# Patient Record
Sex: Female | Born: 1965 | Race: White | Hispanic: No | Marital: Married | Smoking: Former smoker
Health system: Southern US, Community
[De-identification: ages and names within clinical notes are randomized; demographics above are authoritative.]

## PROBLEM LIST (undated history)

## (undated) DIAGNOSIS — R011 Cardiac murmur, unspecified: Secondary | ICD-10-CM

## (undated) DIAGNOSIS — T7840XA Allergy, unspecified, initial encounter: Secondary | ICD-10-CM

## (undated) DIAGNOSIS — J45909 Unspecified asthma, uncomplicated: Secondary | ICD-10-CM

## (undated) DIAGNOSIS — F419 Anxiety disorder, unspecified: Secondary | ICD-10-CM

## (undated) HISTORY — PX: BREAST BIOPSY: SHX20

## (undated) HISTORY — DX: Cardiac murmur, unspecified: R01.1

## (undated) HISTORY — PX: INTRAUTERINE DEVICE (IUD) INSERTION: SHX5877

## (undated) HISTORY — PX: REDUCTION MAMMAPLASTY: SUR839

## (undated) HISTORY — DX: Anxiety disorder, unspecified: F41.9

## (undated) HISTORY — DX: Unspecified asthma, uncomplicated: J45.909

## (undated) HISTORY — DX: Allergy, unspecified, initial encounter: T78.40XA

## (undated) HISTORY — PX: APPENDECTOMY: SHX54

---

## 2003-08-08 LAB — CONVERTED CEMR LAB
HDL: 61 mg/dL
LDL Cholesterol: 165 mg/dL

## 2005-06-21 ENCOUNTER — Encounter: Payer: Self-pay | Admitting: Family Medicine

## 2005-10-30 ENCOUNTER — Ambulatory Visit: Payer: Self-pay | Admitting: Family Medicine

## 2005-12-20 ENCOUNTER — Telehealth (INDEPENDENT_AMBULATORY_CARE_PROVIDER_SITE_OTHER): Payer: Self-pay | Admitting: *Deleted

## 2005-12-20 ENCOUNTER — Telehealth: Payer: Self-pay | Admitting: Family Medicine

## 2005-12-23 ENCOUNTER — Encounter: Payer: Self-pay | Admitting: Family Medicine

## 2005-12-23 DIAGNOSIS — R51 Headache: Secondary | ICD-10-CM | POA: Insufficient documentation

## 2005-12-23 DIAGNOSIS — J3089 Other allergic rhinitis: Secondary | ICD-10-CM

## 2005-12-23 DIAGNOSIS — F411 Generalized anxiety disorder: Secondary | ICD-10-CM | POA: Insufficient documentation

## 2005-12-23 DIAGNOSIS — R519 Headache, unspecified: Secondary | ICD-10-CM | POA: Insufficient documentation

## 2005-12-23 DIAGNOSIS — M542 Cervicalgia: Secondary | ICD-10-CM | POA: Insufficient documentation

## 2005-12-26 ENCOUNTER — Ambulatory Visit: Payer: Self-pay | Admitting: Family Medicine

## 2005-12-26 DIAGNOSIS — E785 Hyperlipidemia, unspecified: Secondary | ICD-10-CM

## 2005-12-26 DIAGNOSIS — M546 Pain in thoracic spine: Secondary | ICD-10-CM

## 2005-12-26 DIAGNOSIS — F341 Dysthymic disorder: Secondary | ICD-10-CM

## 2006-01-06 ENCOUNTER — Telehealth: Payer: Self-pay | Admitting: Family Medicine

## 2006-01-07 ENCOUNTER — Encounter: Payer: Self-pay | Admitting: Family Medicine

## 2006-01-15 ENCOUNTER — Ambulatory Visit: Payer: Self-pay | Admitting: Family Medicine

## 2006-01-28 ENCOUNTER — Telehealth: Payer: Self-pay | Admitting: Family Medicine

## 2006-01-30 ENCOUNTER — Telehealth (INDEPENDENT_AMBULATORY_CARE_PROVIDER_SITE_OTHER): Payer: Self-pay | Admitting: *Deleted

## 2006-01-31 ENCOUNTER — Telehealth (INDEPENDENT_AMBULATORY_CARE_PROVIDER_SITE_OTHER): Payer: Self-pay | Admitting: *Deleted

## 2006-02-19 ENCOUNTER — Encounter: Payer: Self-pay | Admitting: Family Medicine

## 2006-03-31 ENCOUNTER — Encounter: Payer: Self-pay | Admitting: Family Medicine

## 2006-04-22 ENCOUNTER — Encounter: Payer: Self-pay | Admitting: Family Medicine

## 2006-04-28 ENCOUNTER — Telehealth: Payer: Self-pay | Admitting: Family Medicine

## 2006-04-30 ENCOUNTER — Telehealth: Payer: Self-pay | Admitting: Family Medicine

## 2006-08-06 ENCOUNTER — Encounter: Payer: Self-pay | Admitting: Family Medicine

## 2006-10-23 ENCOUNTER — Telehealth: Payer: Self-pay | Admitting: Family Medicine

## 2006-10-23 ENCOUNTER — Ambulatory Visit: Payer: Self-pay | Admitting: Family Medicine

## 2006-10-23 DIAGNOSIS — N920 Excessive and frequent menstruation with regular cycle: Secondary | ICD-10-CM

## 2006-10-23 DIAGNOSIS — IMO0001 Reserved for inherently not codable concepts without codable children: Secondary | ICD-10-CM

## 2006-10-23 DIAGNOSIS — J019 Acute sinusitis, unspecified: Secondary | ICD-10-CM

## 2006-10-23 LAB — CONVERTED CEMR LAB
Hemoglobin: 12.9 g/dL (ref 12.0–15.0)
MCV: 88.9 fL (ref 78.0–100.0)
RDW: 13.1 % (ref 11.5–14.0)
Sed Rate: 7 mm/hr (ref 0–22)
Total CK: 80 units/L (ref 7–177)

## 2006-10-24 ENCOUNTER — Telehealth (INDEPENDENT_AMBULATORY_CARE_PROVIDER_SITE_OTHER): Payer: Self-pay | Admitting: *Deleted

## 2006-11-10 ENCOUNTER — Ambulatory Visit: Payer: Self-pay | Admitting: Family Medicine

## 2006-11-10 DIAGNOSIS — D239 Other benign neoplasm of skin, unspecified: Secondary | ICD-10-CM | POA: Insufficient documentation

## 2006-11-12 ENCOUNTER — Telehealth: Payer: Self-pay | Admitting: Family Medicine

## 2006-11-12 ENCOUNTER — Telehealth (INDEPENDENT_AMBULATORY_CARE_PROVIDER_SITE_OTHER): Payer: Self-pay | Admitting: *Deleted

## 2006-11-17 ENCOUNTER — Telehealth (INDEPENDENT_AMBULATORY_CARE_PROVIDER_SITE_OTHER): Payer: Self-pay | Admitting: *Deleted

## 2006-11-27 ENCOUNTER — Ambulatory Visit: Payer: Self-pay | Admitting: Family Medicine

## 2006-12-04 ENCOUNTER — Telehealth: Payer: Self-pay | Admitting: Family Medicine

## 2006-12-12 ENCOUNTER — Telehealth: Payer: Self-pay | Admitting: Family Medicine

## 2006-12-15 ENCOUNTER — Encounter: Payer: Self-pay | Admitting: Family Medicine

## 2006-12-16 ENCOUNTER — Telehealth: Payer: Self-pay | Admitting: Family Medicine

## 2006-12-30 ENCOUNTER — Ambulatory Visit: Payer: Self-pay | Admitting: Family Medicine

## 2006-12-30 DIAGNOSIS — M5030 Other cervical disc degeneration, unspecified cervical region: Secondary | ICD-10-CM | POA: Insufficient documentation

## 2007-01-02 ENCOUNTER — Encounter: Payer: Self-pay | Admitting: Family Medicine

## 2007-01-02 ENCOUNTER — Encounter: Admission: RE | Admit: 2007-01-02 | Discharge: 2007-01-21 | Payer: Self-pay | Admitting: Family Medicine

## 2007-01-09 ENCOUNTER — Telehealth: Payer: Self-pay | Admitting: Family Medicine

## 2007-01-20 ENCOUNTER — Telehealth: Payer: Self-pay | Admitting: Family Medicine

## 2007-01-22 ENCOUNTER — Encounter: Admission: RE | Admit: 2007-01-22 | Discharge: 2007-01-26 | Payer: Self-pay | Admitting: Family Medicine

## 2007-01-26 ENCOUNTER — Encounter: Payer: Self-pay | Admitting: Family Medicine

## 2007-02-20 ENCOUNTER — Encounter: Payer: Self-pay | Admitting: Family Medicine

## 2010-02-11 ENCOUNTER — Encounter: Payer: Self-pay | Admitting: Family Medicine

## 2013-08-25 DIAGNOSIS — J4520 Mild intermittent asthma, uncomplicated: Secondary | ICD-10-CM | POA: Insufficient documentation

## 2015-05-18 LAB — HM PAP SMEAR: HM Pap smear: NEGATIVE

## 2015-08-16 DIAGNOSIS — Z975 Presence of (intrauterine) contraceptive device: Secondary | ICD-10-CM | POA: Insufficient documentation

## 2016-04-11 ENCOUNTER — Encounter: Payer: Self-pay | Admitting: Physical Therapy

## 2016-04-11 ENCOUNTER — Ambulatory Visit: Payer: Self-pay | Admitting: Physical Therapy

## 2016-04-11 ENCOUNTER — Ambulatory Visit (INDEPENDENT_AMBULATORY_CARE_PROVIDER_SITE_OTHER): Payer: 59 | Admitting: Physical Therapy

## 2016-04-11 DIAGNOSIS — M6281 Muscle weakness (generalized): Secondary | ICD-10-CM | POA: Diagnosis not present

## 2016-04-11 DIAGNOSIS — G8929 Other chronic pain: Secondary | ICD-10-CM

## 2016-04-11 DIAGNOSIS — R293 Abnormal posture: Secondary | ICD-10-CM

## 2016-04-11 DIAGNOSIS — M5442 Lumbago with sciatica, left side: Secondary | ICD-10-CM | POA: Diagnosis not present

## 2016-04-11 NOTE — Therapy (Signed)
[place], [place], [address] Phone: [phone]   Fax:  [phone]  Physical Therapy Evaluation  Patient Details  Name: Adrienne Lara MRN: [id number] Date of Birth: [birth year] Referring Provider: Dr Rolena Infante  Encounter Date: 04/11/2016      PT End of Session - 04/11/16 1413    Visit Number 1   Number of Visits 8   Date for PT Re-Evaluation 05/09/16   PT Start Time 2426   PT Stop Time 1506   PT Time Calculation (min) 53 min   Activity Tolerance Patient tolerated treatment well      History reviewed. No pertinent past medical history.  History reviewed. No pertinent surgical history.  There were no vitals filed for this visit.       Subjective Assessment - 04/11/16 1413    Subjective Pt reports she developed LBP with Lt sided sciatica about a yr ago. Had PT and was making progress then ended up on [place] duty for 1.5 months.  She felt pretty good so didn't return.  She started running and now the pain is beginning to return.  She is able to perform everyday activities now and she want to get back to running long distances. MD told her goal of getting to 20 mile a week.    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     How long can you walk comfortably? pain after 1.2 miles running.    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 Yes   Pain Score 2   stops running before the pain gets to bad.    Pain Location Buttocks   Pain Orientation Left   Pain Descriptors / Indicators Dull   Pain Type Chronic pain   Pain Radiating Towards into Lt greater troch area    Pain Onset More than a month ago   Pain Frequency Intermittent   Aggravating Factors  after a run   Pain  Relieving Factors ice, prone press ups and rest. Then a hot bath.             [facility] PT Assessment - 04/11/16 0001      Assessment   Medical Diagnosis LBP with Lt sciatica   Referring Provider Dr Rolena Infante   Onset Date/Surgical Date 04/12/15   Next MD Visit 3 months   Prior Therapy ~ 3 months ago     Precautions   Precautions None     Balance Screen   Has the patient fallen in the past 6 months No     [place] residence   Living Arrangements Spouse/significant other   Home Layout Two level  no trouble     Prior Function   Level of Independence Independent  can't bend over to tie shoes   Vocation Part time employment   Vocation Requirements driving medical transport   Leisure running, weight training     Observation/Other Assessments   Focus on Therapeutic Outcomes (FOTO)  28% limited     Functional Tests   Functional tests Squat;Single leg stance     Squat   Comments WNl     Single Leg Stance   Comments bilat trendelenburg hip drop with  initiation of SLS     Posture/Postural Control   Posture/Postural Control Postural limitations   Postural Limitations Decreased thoracic kyphosis  Rt hip higher curve in thoracic with shift to Rt    Posture Comments in prpne pt had straight spine     ROM / Strength   AROM / PROM / Strength AROM;Strength     AROM   AROM Assessment Site Lumbar   Lumbar Flexion WNL   Lumbar Extension WNL  slight pain Lt   Lumbar - Right Side Bend WNL   Lumbar - Left Side Bend decreased 75% with pain  slight pain Lt   Lumbar - Right Rotation WNL    Lumbar - Left Rotation WNL pain lt side     Strength   Strength Assessment Site Hip;Knee;Ankle;Lumbar   Right/Left Hip Right;Left   Right Hip Flexion 5/5   Right Hip Extension --  5-/5   Right Hip ABduction 4+/5   Left Hip Flexion 5/5   Left Hip Extension 4+/5   Left Hip ABduction 4+/5   Right/Left Knee --  WNL   Right/Left Ankle --  WNL   Lumbar  Flexion --  strong contraction TA   Lumbar Extension --  multifidi - strong contraction - fatigued quickly      Flexibility   Soft Tissue Assessment /Muscle Length yes  TFL tight Rt side   Hamstrings supine SLR Rt 80 degrees, Lt 78    Quadriceps WNL   ITB WNL   Piriformis WNL   Quadratus Lumborum WNL     Palpation   Spinal mobility slightly hypomobile in lumbar spine   Palpation comment trigger points and pain in Lt gluts, piriformis, QL and lumbar paraspinals.      Special Tests    Special Tests --  (-) Joelyn Oms                   [facility] Adult PT Treatment/Exercise - 04/11/16 0001      Self-Care   Self-Care Other Self-Care Comments   Other Self-Care Comments  self trigger point release to lumbar paraspinals, gluts and piriformis with ball in standing against wall      Exercises   Exercises Lumbar     Lumbar Exercises: Stretches   Lower Trunk Rotation 1 rep;30 seconds  each side with feet off the floor     Lumbar Exercises: Prone   Other Prone Lumbar Exercises 10 reps pelvic press with bilat knee flex/ext     Lumbar Exercises: Quadruped   Madcat/Old Horse 10 reps  focus on mad cat                PT Education - 04/11/16 1458    Education provided Yes   Education Details HEP , DN, and trigger point release with a ball   Person(s) Educated Patient   Methods Explanation;Demonstration;Handout   Comprehension Returned demonstration;Verbalized understanding             PT Long Term Goals - 04/11/16 1411      PT LONG TERM GOAL #1   Title I with advanced HEP ( 05/09/16)   Time 4   Period Weeks   Status New     PT LONG TERM GOAL #2   Title improve FOTO =/< 22% limited ( 05/09/16)    Time 4   Period Weeks   Status New     PT LONG TERM GOAL #3   Title report overall reduction of pain =/> 75% to allow her  to run =/> 4 miles ( 05/09/16)    Time 4   Period Weeks   Status New     PT LONG TERM GOAL #4   Title tolerate high level single  leg stance activites with good form ( 05/09/16)    Time 4   Period Weeks   Status New     PT LONG TERM GOAL #5   Title tolerate high level core exercise with good form and no back pain ( 05/09/16)    Time 4   Period Weeks   Status New               Plan - 04/11/16 1508    Clinical Impression Statement 51 yo female presents for low complexity PT eval.  She has a long h/o LBP that has gotten progressively worse and is no limiting her ability to run.  She is very tight in the Lt lower quadrant with multiple trigger points.  She has high level functional weakness in her hips and core.  Adrienne Lara demo's functional scoliosis in standing with lateral shift to the Rt through the thoracic region, Rt iliac crest higher.  She has good alignment when lying down. She would benefit from manual work to release her trigger points, core strengthening and higher level SLS activities to allow her to return to running.    Rehab Potential Good   PT Frequency 2x / week   PT Duration 4 weeks   PT Treatment/Interventions Dry needling;Taping;Therapeutic exercise;Ultrasound;Moist Heat;Manual techniques;Neuromuscular re-education;Cryotherapy;Electrical Stimulation;Iontophoresis 4mg /ml Dexamethasone;Patient/family education   PT Next Visit Plan DN Lt gluts/piriformis/QL/lumbar paraspinals, manaul work, progress core and into SLS activities   Consulted and Agree with Plan of Care Patient      Patient will benefit from skilled therapeutic intervention in order to improve the following deficits and impairments:  Postural dysfunction, Decreased strength, Pain, Increased muscle spasms  Visit Diagnosis: Chronic left-sided low back pain with left-sided sciatica - Plan: PT plan of care cert/re-cert  Muscle weakness (generalized) - Plan: PT plan of care cert/re-cert  Abnormal posture - Plan: PT plan of care cert/re-cert     Problem List Patient Active Problem List   Diagnosis Date Noted  . [place] DISEASE, CERVICAL  12/30/2006  . NEVUS, ATYPICAL 11/10/2006  . SINUSITIS, ACUTE NOS 10/23/2006  . EXCESSIVE MENSTRUATION 10/23/2006  . MYALGIA 10/23/2006  . HYPERLIPIDEMIA 12/26/2005  . DISORDER, DYSTHYMIC 12/26/2005  . PAIN IN THORACIC SPINE 12/26/2005  . ANXIETY 12/23/2005  . ALLERGIC RHINITIS 12/23/2005  . NECK PAIN 12/23/2005  . HEADACHE 12/23/2005    Boneta Lucks rPT  04/11/2016, 3:17 PM  [facility] [place], [place], [address] Phone: [phone]   Fax:  [phone]  Name: Adrienne Lara MRN: [id number] Date of Birth: Jan 25, 1965

## 2016-04-11 NOTE — Patient Instructions (Addendum)
Pelvic Press    Place hands under belly between navel and pubic bone, palms up. Feel pressure on hands. Increase pressure on hands by pressing pelvis down. This is NOT a pelvic tilt. Hold the press and bend knees, slowly return the feet down maintaining the press. Relax. Repeat _10__ times. Once a day.  Cat / Cow Flow    Inhale, press spine toward ceiling like a Halloween cat. Keeping strength in arms and abdominals, exhale to soften spine through neutral and into cow pose. Open chest and arch back. Initiate movement between cat and cow at tailbone, one vertebrae at a time. Repeat _10___ times. Do 1-2 times a day.   Lower Trunk Rotation Stretch    Keeping back flat and feet together, rotate knees to left side, have feet off the floor before you rotate. Hold _30-45___ seconds. Repeat __1-2__ times per set. Do __1__ sets per session. Do __1-2__ sessions per day.  Use a tennis ball, lean into a wall and roll up/down the tender spots, use heat .  Trigger Point Dry Needling  . What is Trigger Point Dry Needling (DN)? o DN is a physical therapy technique used to treat muscle pain and dysfunction. Specifically, DN helps deactivate muscle trigger points (muscle knots).  o A thin filiform needle is used to penetrate the skin and stimulate the underlying trigger point. The goal is for a local twitch response (LTR) to occur and for the trigger point to relax. No medication of any kind is injected during the procedure.   . What Does Trigger Point Dry Needling Feel Like?  o The procedure feels different for each individual patient. Some patients report that they do not actually feel the needle enter the skin and overall the process is not painful. Very mild bleeding may occur. However, many patients feel a deep cramping in the muscle in which the needle was inserted. This is the local twitch response.   [name] How Will I feel after the treatment? o Soreness is normal, and the onset of soreness may not  occur for a few hours. Typically this soreness does not last longer than two days.  o Bruising is uncommon, however; ice can be used to decrease any possible bruising.  o In rare cases feeling tired or nauseous after the treatment is normal. In addition, your symptoms may get worse before they get better, this period will typically not last longer than 24 hours.   . What Can I do After My Treatment? o Increase your hydration by drinking more water for the next 24 hours. o You may place ice or heat on the areas treated that have become sore, however, do not use heat on inflamed or bruised areas. Heat often brings more relief post needling. o You can continue your regular activities, but vigorous activity is not recommended initially after the treatment for 24 hours. o DN is best combined with other physical therapy such as strengthening, stretching, and other therapies.   Copyright  VHI. All rights reserved.

## 2016-04-17 ENCOUNTER — Ambulatory Visit (INDEPENDENT_AMBULATORY_CARE_PROVIDER_SITE_OTHER): Payer: 59 | Admitting: Physical Therapy

## 2016-04-17 ENCOUNTER — Encounter: Payer: Self-pay | Admitting: Physical Therapy

## 2016-04-17 DIAGNOSIS — G8929 Other chronic pain: Secondary | ICD-10-CM | POA: Diagnosis not present

## 2016-04-17 DIAGNOSIS — R293 Abnormal posture: Secondary | ICD-10-CM | POA: Diagnosis not present

## 2016-04-17 DIAGNOSIS — M6281 Muscle weakness (generalized): Secondary | ICD-10-CM

## 2016-04-17 DIAGNOSIS — M5442 Lumbago with sciatica, left side: Secondary | ICD-10-CM | POA: Diagnosis not present

## 2016-04-17 NOTE — Patient Instructions (Signed)

## 2016-04-17 NOTE — Therapy (Signed)
[place], [place], [address] Phone: [phone]   Fax:  [phone]  Physical Therapy Treatment  Patient Details  Name: Adrienne Lara MRN: [id number] Date of Birth: Nov 01, 1965 Referring Provider: Dr Rolena Infante  Encounter Date: 04/17/2016      PT End of Session - 04/17/16 0936    Visit Number 2   Number of Visits 8   Date for PT Re-Evaluation 05/09/16   PT Start Time 0936   PT Stop Time 1030   PT Time Calculation (min) 54 min   Activity Tolerance Patient tolerated treatment well      History reviewed. No pertinent past medical history.  History reviewed. No pertinent surgical history.  There were no vitals filed for this visit.      Subjective Assessment - 04/17/16 0936    Subjective Pt reports she is hurting  a little more today. Has been doing her HEP however doesn't feel like they are helping.  The day after eval she helped husband move a dresser up 2 flights of stairs.    Patient Stated Goals 15-20 mile of running a week, return to weight training ( free wts and machines)    Currently in Pain? Yes   Pain Score 4    Pain Location Buttocks   Pain Orientation Left   Pain Descriptors / Indicators Aching;Tightness   Pain Type Chronic pain   Pain Radiating Towards into lateral Lt thigh   Pain Frequency Intermittent   Aggravating Factors  helping her husband move a dresser up stairs, not sure how exercise effect it.    Pain Relieving Factors nothing ( has not iced or performed press ups this past week)                          [facility] Adult PT Treatment/Exercise - 04/17/16 0001      Lumbar Exercises: Aerobic   Stationary Bike Nustep L4 x 5'     Lumbar Exercises: Prone   Other Prone Lumbar Exercises press ups x 10reps     Modalities   Modalities Electrical Stimulation;Moist Heat     Moist Heat Therapy   Number Minutes Moist Heat 20 Minutes   Moist Heat Location  Lumbar Spine  buttocks     Electrical Stimulation   Electrical Stimulation Location Lt low back and buttocks   Electrical Stimulation Action IFC   Electrical Stimulation Parameters  to tolerance   Electrical Stimulation Goals Pain;Tone     Manual Therapy   Manual Therapy Soft tissue mobilization   Soft tissue mobilization STW to Lt sided low back and gluts, trigger point release          Trigger Point Dry Needling - 04/17/16 0945    Consent Given? Yes   Education Handout Provided Yes   Muscles Treated Lower Body Gluteus maximus;Gluteus minimus  QL Lt side   Gluteus Maximus Response Palpable increased muscle length;Twitch response elicited  Lt   Gluteus Minimus Response Palpable increased muscle length;Twitch response elicited  LT                    PT Long Term Goals - 04/17/16 2979      PT LONG TERM GOAL #1   Title I with advanced HEP ( 05/09/16)   Status On-going     PT LONG TERM GOAL #2   Title improve FOTO =/< 22% limited ( 05/09/16)    Status On-going  PT LONG TERM GOAL #3   Title report overall reduction of pain =/> 75% to allow her to run =/> 4 miles ( 05/09/16)    Status On-going     PT LONG TERM GOAL #4   Title tolerate high level single leg stance activites with good form ( 05/09/16)    Status On-going     PT LONG TERM GOAL #5   Title tolerate high level core exercise with good form and no back pain ( 05/09/16)    Status On-going               Plan - 04/17/16 0943    Clinical Impression Statement This is Liza's second visit, no goals met.  She did help her husband move a Ecologist and has had increased pain since. She has also not iced or performed prone press ups this past week.  [name] has significant tightness in her LT QL and gluts,  had great releases with DN and manual work to these areas.     Rehab Potential Good   PT Frequency 2x / week   PT Duration 4 weeks   PT Treatment/Interventions Dry needling;Taping;Therapeutic  exercise;Ultrasound;Moist Heat;Manual techniques;Neuromuscular re-education;Cryotherapy;Electrical Stimulation;Iontophoresis 69m/ml Dexamethasone;Patient/family education   Consulted and Agree with Plan of Care Patient      Patient will benefit from skilled therapeutic intervention in order to improve the following deficits and impairments:  Postural dysfunction, Decreased strength, Pain, Increased muscle spasms  Visit Diagnosis: Chronic left-sided low back pain with left-sided sciatica  Muscle weakness (generalized)  Abnormal posture     Problem List Patient Active Problem List   Diagnosis Date Noted  . [place]EASE, CERVICAL 12/30/2006  . NEVUS, ATYPICAL 11/10/2006  . SINUSITIS, ACUTE NOS 10/23/2006  . EXCESSIVE MENSTRUATION 10/23/2006  . MYALGIA 10/23/2006  . HYPERLIPIDEMIA 12/26/2005  . DISORDER, DYSTHYMIC 12/26/2005  . PAIN IN THORACIC SPINE 12/26/2005  . ANXIETY 12/23/2005  . ALLERGIC RHINITIS 12/23/2005  . NECK PAIN 12/23/2005  . HEADACHE 12/23/2005    SJeral PinchPT  04/17/2016, 10:31 AM  [facility] [place] [address]Phone: [phone]  Fax:  [phone] Name: ETHAMAR HOLIKMRN: [id number] of Birth: 607-04-1965

## 2016-05-01 ENCOUNTER — Ambulatory Visit (INDEPENDENT_AMBULATORY_CARE_PROVIDER_SITE_OTHER): Payer: 59 | Admitting: Physical Therapy

## 2016-05-01 ENCOUNTER — Encounter: Payer: Self-pay | Admitting: Physical Therapy

## 2016-05-01 DIAGNOSIS — M5442 Lumbago with sciatica, left side: Secondary | ICD-10-CM

## 2016-05-01 DIAGNOSIS — M6281 Muscle weakness (generalized): Secondary | ICD-10-CM

## 2016-05-01 DIAGNOSIS — G8929 Other chronic pain: Secondary | ICD-10-CM

## 2016-05-01 DIAGNOSIS — R293 Abnormal posture: Secondary | ICD-10-CM | POA: Diagnosis not present

## 2016-05-01 NOTE — Therapy (Signed)
[place], [place], [address] Phone: [phone]   Fax:  [phone]  Physical Therapy Treatment  Patient Details  Name: Adrienne Lara MRN: [id number] Date of Birth: 1965/08/08 Referring Provider: Dr Rolena Infante  Encounter Date: 05/01/2016      PT End of Session - 05/01/16 0925    Visit Number 3   Number of Visits 8   Date for PT Re-Evaluation 05/09/16   PT Start Time 0925   PT Stop Time 1029   PT Time Calculation (min) 64 min   Activity Tolerance Patient tolerated treatment well      History reviewed. No pertinent past medical history.  History reviewed. No pertinent surgical history.  There were no vitals filed for this visit.      Subjective Assessment - 05/01/16 0927    Subjective Adrienne Lara thinks her pain is a little worse, she was on vacation and walking a lot for 4 days, has been doing her HEP, frustrated with her weight also   Patient Stated Goals 15-20 mile of running a week, return to weight training ( free wts and machines)    Currently in Pain? Yes   Pain Score 2    Pain Location Back   Pain Orientation Left;Lower   Pain Descriptors / Indicators Aching;Tightness   Pain Type Chronic pain   Pain Onset More than a month ago   Pain Frequency Intermittent   Aggravating Factors  a lot of walking   Pain Relieving Factors nothing                         [facility] Adult PT Treatment/Exercise - 05/01/16 0001      Lumbar Exercises: Aerobic   Stationary Bike Nustep L4 x 5'     Lumbar Exercises: Supine   Ab Set 10 reps;5 seconds   Clam 10 reps;1 second  each side   Clam Limitations VC for form     Lumbar Exercises: Prone   Other Prone Lumbar Exercises pelvic press, then with single knee flex.ext, bilat knee flex/ext     Modalities   Modalities Electrical Stimulation;Moist Heat     Moist Heat Therapy   Number Minutes Moist Heat 20 Minutes   Moist Heat Location  Lumbar Spine     Electrical Stimulation   Electrical Stimulation Location Lt low back and buttocks   Electrical Stimulation Action IFC   Electrical Stimulation Parameters  to tolerance   Electrical Stimulation Goals Pain;Tone     Manual Therapy   Manual Therapy Soft tissue mobilization   Soft tissue mobilization STW to Lt sided low back and gluts, trigger point release          Trigger Point Dry Needling - 05/01/16 0945    Consent Given? Yes   Education Handout Provided No   Muscles Treated Upper Body Quadratus Lumborum;Longissimus  Lt   Muscles Treated Lower Body Gluteus maximus;Gluteus minimus;Piriformis  Lt side   Longissimus Response Palpable increased muscle length;Twitch response elicited  K4-8   Gluteus Maximus Response Palpable increased muscle length;Twitch response elicited   Gluteus Minimus Response Twitch response elicited;Palpable increased muscle length   Piriformis Response Palpable increased muscle length;Twitch response elicited                   PT Long Term Goals - 05/01/16 1153      PT LONG TERM GOAL #1   Title I with advanced HEP ( 05/09/16)  Status On-going     PT LONG TERM GOAL #2   Title improve FOTO =/< 22% limited ( 05/09/16)    Status On-going     PT LONG TERM GOAL #3   Title report overall reduction of pain =/> 75% to allow her to run =/> 4 miles ( 05/09/16)    Status On-going     PT LONG TERM GOAL #4   Title tolerate high level single leg stance activites with good form ( 05/09/16)    Status On-going     PT LONG TERM GOAL #5   Title tolerate high level core exercise with good form and no back pain ( 05/09/16)    Status On-going               Plan - 05/01/16 1149    Clinical Impression Statement Adrienne Lara is frustrated with her progress, explained she has only been for two visits and then on vacation with lots of walking so she shouldn't expect progress.  She tolerated DN better than last visit with decreased muscular  tightness.  She is very weak in her core, HEP was progressed slowly.  No goals met   Rehab Potential Good   PT Frequency 2x / week   PT Duration 4 weeks   PT Treatment/Interventions Dry needling;Taping;Therapeutic exercise;Ultrasound;Moist Heat;Manual techniques;Neuromuscular re-education;Cryotherapy;Electrical Stimulation;Iontophoresis 109m/ml Dexamethasone;Patient/family education   PT Next Visit Plan DN Lt gluts/piriformis/QL/lumbar paraspinals, manaul work, progress core and into SLS activities   Consulted and Agree with Plan of Care Patient      Patient will benefit from skilled therapeutic intervention in order to improve the following deficits and impairments:  Postural dysfunction, Decreased strength, Pain, Increased muscle spasms  Visit Diagnosis: Abnormal posture  Muscle weakness (generalized)  Chronic left-sided low back pain with left-sided sciatica     Problem List Patient Active Problem List   Diagnosis Date Noted  . DGenevaDISEASE, CERVICAL 12/30/2006  . NEVUS, ATYPICAL 11/10/2006  . SINUSITIS, ACUTE NOS 10/23/2006  . EXCESSIVE MENSTRUATION 10/23/2006  . MYALGIA 10/23/2006  . HYPERLIPIDEMIA 12/26/2005  . DISORDER, DYSTHYMIC 12/26/2005  . PAIN IN THORACIC SPINE 12/26/2005  . ANXIETY 12/23/2005  . ALLERGIC RHINITIS 12/23/2005  . NECK PAIN 12/23/2005  . HEADACHE 12/23/2005    SJeral PinchPT  05/01/2016, 11:54 AM  [facility] [address]Phone: [phone]  Fax:  [phone] Name: Adrienne ELLINGERMRN: [id number] of Birth: 6December 16, 1967

## 2016-05-01 NOTE — Patient Instructions (Signed)
Pelvic Press     Place hands under belly between navel and pubic bone, palms up. Feel pressure on hands. Increase pressure on hands by pressing pelvis down. This is NOT a pelvic tilt. Hold __5_ seconds. Relax. Repeat _10__ times. Once a day.  KNEE: Flexion - Prone   Hold pelvic press. Bend knee, then return the foot down. Repeat on opposite leg. Do not raise hips. _10__ reps per set. When this is mastered, pull both heels up at same time, x 10 reps.  Once a day  Abdominal Bracing With Pelvic Floor (Hook-Lying)   With neutral spine, tighten pelvic floor and abdominals. Hold 10 seconds. Repeat __10_ times. Do _1__ times a day.  Hip External Rotation With Pillow: Transverse Plane Stability   One knee bent, one leg straight, on pillow. Slowly roll bent knee out. Be sure pelvis does not rotate. Do _10__ times. Restabilize pelvis. Repeat with other leg. Do _1-2__ sets, _1__ times per day.  [facility] [place] [place], [place] [address] [phone] (office)

## 2016-05-03 ENCOUNTER — Ambulatory Visit (INDEPENDENT_AMBULATORY_CARE_PROVIDER_SITE_OTHER): Payer: 59 | Admitting: Physical Therapy

## 2016-05-03 DIAGNOSIS — M5442 Lumbago with sciatica, left side: Secondary | ICD-10-CM | POA: Diagnosis not present

## 2016-05-03 DIAGNOSIS — G8929 Other chronic pain: Secondary | ICD-10-CM

## 2016-05-03 DIAGNOSIS — M6281 Muscle weakness (generalized): Secondary | ICD-10-CM | POA: Diagnosis not present

## 2016-05-03 DIAGNOSIS — R293 Abnormal posture: Secondary | ICD-10-CM

## 2016-05-03 NOTE — Therapy (Signed)
[place], [place], [address] Phone: [phone]   Fax:  [phone]  Physical Therapy Treatment  Patient Details  Name: Adrienne Lara MRN: [id number] Date of Birth: Apr 16, 1965 Referring Provider: Dr Rolena Infante  Encounter Date: 05/03/2016      PT End of Session - 05/03/16 0939    Visit Number 4   Number of Visits 8   Date for PT Re-Evaluation 05/09/16   PT Start Time 0936   PT Stop Time 1018   PT Time Calculation (min) 42 min      No past medical history on file.  No past surgical history on file.  There were no vitals filed for this visit.      Subjective Assessment - 05/03/16 0939    Subjective Pt reports she is having a little more knee pain today. She felt a little better after needling, but is unsure if much has changed. She is anxious to return to running, but is holding off until we tell her to begin.    Currently in Pain? Yes   Pain Score 2    Pain Location Back   Pain Descriptors / Indicators Aching   Pain Radiating Towards down Lt lateral thigh.    Aggravating Factors  prolonged walking    Pain Relieving Factors ??           [facility] Adult PT Treatment/Exercise - 05/03/16 0001      Lumbar Exercises: Stretches   Passive Hamstring Stretch 2 reps;30 seconds  supine with strap, each leg   Double Knee to Chest Stretch 1 rep;30 seconds   Lower Trunk Rotation 5 reps  leg straight, opp arm flexed up.    ITB Stretch 2 reps;30 seconds  supine with strap, tactile cues for form   ITB Stretch Limitations then adductor stretch with strap x 45 sec.    Piriformis Stretch 1 rep;20 seconds  LLE     Lumbar Exercises: Aerobic   Stationary Bike Nustep L4 x 5'     Lumbar Exercises: Supine   Ab Set 10 reps;5 seconds   AB Set Limitations VC for neutral spine, not flattening of back.    Clam 5 reps;1 second  with ab set    Clam Limitations VC for form   Bent Knee Raise 15 reps  with  ab set   Bent Knee Raise Limitations biofeedback bladder under small of back for visual feedback of neutral spine.      Manual Therapy   Manual Therapy Soft tissue mobilization;Taping   Soft tissue mobilization Edge tool assistance to Lt lateral thigh distal to proximal to decrease fascial restrictions and decrease pain.    Kinesiotex Social worker I strip of Rock tape along Lt ITB, perpendicular short I strips at distal ITB and mid ITB (area of increased tightness) - to decrease pain, increase proprioception, decompress tissue                PT Education - 05/03/16 1122    Education provided Yes   Education Details HEP - added ITB and adductor stretch. (also quad and hamstring stretch, not included in handout)   Person(s) Educated Patient   Methods Handout;Demonstration;Explanation   Comprehension Verbalized understanding;Returned demonstration             PT Long Term Goals - 05/01/16 1153      PT LONG TERM GOAL #1   Title I with advanced  HEP ( 05/09/16)   Status On-going     PT LONG TERM GOAL #2   Title improve FOTO =/< 22% limited ( 05/09/16)    Status On-going     PT LONG TERM GOAL #3   Title report overall reduction of pain =/> 75% to allow her to run =/> 4 miles ( 05/09/16)    Status On-going     PT LONG TERM GOAL #4   Title tolerate high level single leg stance activites with good form ( 05/09/16)    Status On-going     PT LONG TERM GOAL #5   Title tolerate high level core exercise with good form and no back pain ( 05/09/16)    Status On-going               Plan - 05/03/16 1120    Clinical Impression Statement Pt tolerated all exercises well without increase in symptoms.  pt required frequent cues for proper form of exercises.  She reported slight decrease in Lt thigh pain after manual therapy and taping.  Pt making gradual progress towards goals.    Rehab Potential Good   PT Frequency 2x / week   PT Duration 4  weeks   PT Treatment/Interventions Dry needling;Taping;Therapeutic exercise;Ultrasound;Moist Heat;Manual techniques;Neuromuscular re-education;Cryotherapy;Electrical Stimulation;Iontophoresis 4mg /ml Dexamethasone;Patient/family education   PT Next Visit Plan Review LE stretches, manual work to [place].  Assess response to Rock tape .   Consulted and Agree with Plan of Care Patient      Patient will benefit from skilled therapeutic intervention in order to improve the following deficits and impairments:  Postural dysfunction, Decreased strength, Pain, Increased muscle spasms  Visit Diagnosis: Chronic left-sided low back pain with left-sided sciatica  Abnormal posture  Muscle weakness (generalized)     Problem List Patient Active Problem List   Diagnosis Date Noted  . [place] DISEASE, CERVICAL 12/30/2006  . NEVUS, ATYPICAL 11/10/2006  . SINUSITIS, ACUTE NOS 10/23/2006  . EXCESSIVE MENSTRUATION 10/23/2006  . MYALGIA 10/23/2006  . HYPERLIPIDEMIA 12/26/2005  . DISORDER, DYSTHYMIC 12/26/2005  . PAIN IN THORACIC SPINE 12/26/2005  . ANXIETY 12/23/2005  . ALLERGIC RHINITIS 12/23/2005  . NECK PAIN 12/23/2005  . HEADACHE 12/23/2005   Kerin Perna, PTA 05/03/16 11:31 AM  [place] [facility] [place] [place], [place], [address] Phone: [phone]   Fax:  [phone]  Name: Adrienne Lara MRN: [id number] Date of Birth: [birth year]

## 2016-05-03 NOTE — Patient Instructions (Addendum)
Adductor Stretch: Reclined (Strap, Wall)    Warm up with leg vertical. Rotate leg to side and fix foot to wall. Anchor opposite hip. Hold for ___15-30_ seconds. Repeat __2-3__ times each leg.  Copyright  VHI. All rights reserved.  Outer Hip Stretch: Reclined IT Band Stretch (Strap)    Strap around opposite foot, pull across only as far as possible with shoulders on mat. Hold for __30__ seconds. Repeat __2-3__ times each leg.  Copyright  VHI. All rights reserved.

## 2016-05-06 ENCOUNTER — Encounter: Payer: Self-pay | Admitting: Physical Therapy

## 2016-05-06 ENCOUNTER — Ambulatory Visit (INDEPENDENT_AMBULATORY_CARE_PROVIDER_SITE_OTHER): Payer: 59 | Admitting: Physical Therapy

## 2016-05-06 DIAGNOSIS — R293 Abnormal posture: Secondary | ICD-10-CM

## 2016-05-06 DIAGNOSIS — G8929 Other chronic pain: Secondary | ICD-10-CM

## 2016-05-06 DIAGNOSIS — M5442 Lumbago with sciatica, left side: Secondary | ICD-10-CM | POA: Diagnosis not present

## 2016-05-06 DIAGNOSIS — M6281 Muscle weakness (generalized): Secondary | ICD-10-CM | POA: Diagnosis not present

## 2016-05-06 NOTE — Therapy (Signed)
[facility] [place], [place], [address] Phone: [phone]   Fax:  [phone]  Physical Therapy Treatment  Patient Details  Name: Adrienne Lara MRN: [id number] Date of Birth: 03/08/1965 Referring Provider: Dr Rolena Infante  Encounter Date: 05/06/2016      PT End of Session - 05/06/16 1148    Visit Number 5   Number of Visits 8   Date for PT Re-Evaluation 05/09/16   PT Start Time 1109   PT Stop Time 1205   PT Time Calculation (min) 56 min   Activity Tolerance Patient tolerated treatment well      History reviewed. No pertinent past medical history.  History reviewed. No pertinent surgical history.  There were no vitals filed for this visit.      Subjective Assessment - 05/06/16 1110    Subjective Distal knee pain is better, now the pain is in the lateral hip, thinks the tape has helped. Want to try some more DN   Patient Stated Goals 15-20 mile of running a week, return to weight training ( free wts and machines)    Currently in Pain? Yes   Pain Score 2    Pain Location Back   Pain Orientation Left   Pain Descriptors / Indicators Dull   Pain Type Chronic pain   Pain Radiating Towards Lt hip   Pain Relieving Factors tape and DN            [facility] PT Assessment - 05/06/16 0001      Assessment   Medical Diagnosis LBP with Lt sciatica   Referring Provider Dr Rolena Infante     Strength   Left Hip ABduction 5/5                     [facility] Adult PT Treatment/Exercise - 05/06/16 0001      Lumbar Exercises: Stretches   ITB Stretch 2 reps;30 seconds  with strap bilat   ITB Stretch Limitations 2x30 sec hip adductor stretches iwth strap     Modalities   Modalities Electrical Stimulation;Moist Heat     Moist Heat Therapy   Number Minutes Moist Heat 20 Minutes   Moist Heat Location --  Lt thigh     Electrical Stimulation   Electrical Stimulation Location Lt thigh   Electrical  Stimulation Action IFC   Electrical Stimulation Parameters  to tolerance   Electrical Stimulation Goals Pain;Tone     Manual Therapy   Manual Therapy Soft tissue mobilization;Taping   Soft tissue mobilization manual STW to Lt lateral and medial   hold on tape today          Trigger Point Dry Needling - 05/06/16 1142    Muscles Treated Lower Body Tensor fascia lata;Adductor longus/brevius/maximus  Lt LE   Tensor Fascia Lata Response Palpable increased muscle length;Twitch response elicited   Adductor Response Palpable increased muscle length;Twitch response elicited                   PT Long Term Goals - 05/01/16 1153      PT LONG TERM GOAL #1   Title I with advanced HEP ( 05/09/16)   Status On-going     PT LONG TERM GOAL #2   Title improve FOTO =/< 22% limited ( 05/09/16)    Status On-going     PT LONG TERM GOAL #3   Title report overall reduction of pain =/> 75% to allow her to run =/> 4 miles (  05/09/16)    Status On-going     PT LONG TERM GOAL #4   Title tolerate high level single leg stance activites with good form ( 05/09/16)    Status On-going     PT LONG TERM GOAL #5   Title tolerate high level core exercise with good form and no back pain ( 05/09/16)    Status On-going               Plan - 05/06/16 1157    Clinical Impression Statement [name] had some big releases during treatment today in her hip adductors and smaller ones in the Lt TFL and lateral hamstrings. No new goals met, maybe ready to attempt some interval jogging this week. Slow gradual progress.    Rehab Potential Good   PT Frequency 2x / week   PT Duration 4 weeks   PT Treatment/Interventions Dry needling;Taping;Therapeutic exercise;Ultrasound;Moist Heat;Manual techniques;Neuromuscular re-education;Cryotherapy;Electrical Stimulation;Iontophoresis 35m/ml Dexamethasone;Patient/family education   PT Next Visit Plan assess response to DN, attempt some interval joggin   Consulted and Agree  with Plan of Care Patient      Patient will benefit from skilled therapeutic intervention in order to improve the following deficits and impairments:  Postural dysfunction, Decreased strength, Pain, Increased muscle spasms  Visit Diagnosis: Muscle weakness (generalized)  Abnormal posture  Chronic left-sided low back pain with left-sided sciatica     Problem List Patient Active Problem List   Diagnosis Date Noted  . [place] CERV[place] 12/30/2006  . NEVUS, ATYPICAL 11/10/2006  . SINUSITIS, ACUTE NOS 10/23/2006  . EXCESSIVE MENSTRUATION 10/23/2006  . MYALGIA 10/23/2006  . HYPERLIPIDEMIA 12/26/2005  . DISORDER, DYSTHYMIC 12/26/2005  . PAIN IN THORACIC SPINE 12/26/2005  . ANXIETY 12/23/2005  . ALLERGIC RHINITIS 12/23/2005  . NECK PAIN 12/23/2005  . HEADACHE 12/23/2005    SJeral PinchPT  05/06/2016, 12:36 PM  [facility] [address]Phone: [phone]  Fax:  [phone] Name: EDULCY SIDAMRN: [id number] of Birth: [birth year]

## 2016-05-08 ENCOUNTER — Ambulatory Visit (INDEPENDENT_AMBULATORY_CARE_PROVIDER_SITE_OTHER): Payer: 59 | Admitting: Physical Therapy

## 2016-05-08 DIAGNOSIS — G8929 Other chronic pain: Secondary | ICD-10-CM | POA: Diagnosis not present

## 2016-05-08 DIAGNOSIS — M6281 Muscle weakness (generalized): Secondary | ICD-10-CM

## 2016-05-08 DIAGNOSIS — M5442 Lumbago with sciatica, left side: Secondary | ICD-10-CM | POA: Diagnosis not present

## 2016-05-08 DIAGNOSIS — R293 Abnormal posture: Secondary | ICD-10-CM | POA: Diagnosis not present

## 2016-05-08 NOTE — Therapy (Signed)
[place], [place], [address] Phone: [phone]   Fax:  [phone]  Physical Therapy Treatment  Patient Details  Name: Adrienne Lara MRN: [id number] Date of Birth: January 04, 1966 Referring Provider: Dr. Rolena Infante   Encounter Date: 05/08/2016      PT End of Session - 05/08/16 1106    Visit Number 6   Number of Visits 8   Date for PT Re-Evaluation 05/09/16   PT Start Time 1102   PT Stop Time 1212   PT Time Calculation (min) 70 min   Activity Tolerance Patient tolerated treatment well   Behavior During Therapy [facility] for tasks assessed/performed      No past medical history on file.  No past surgical history on file.  There were no vitals filed for this visit.      Subjective Assessment - 05/08/16 1106    Subjective Pt reports she has been busy with her daughter's play. Her Lt hip is feeling much better; continues with pain in hip when sitting / driving.  Her hip/LE hurt for about an hour after DN, then resolved with taking ibprofen.    Patient Stated Goals 15-20 mile of running a week, return to weight training ( free wts and machines)    Currently in Pain? Yes   Pain Score 2    Pain Location Hip   Pain Orientation Left   Pain Descriptors / Indicators Dull   Aggravating Factors  prolonged walking, sitting   Pain Relieving Factors Rock tape and DN. medication.             [facility] PT Assessment - 05/08/16 0001      Assessment   Medical Diagnosis LBP with Lt sciatica   Referring Provider Dr. Rolena Infante    Onset Date/Surgical Date 04/12/15           [facility] Adult PT Treatment/Exercise - 05/08/16 0001      Lumbar Exercises: Stretches   Passive Hamstring Stretch 2 reps;30 seconds  each leg, supine with strap   ITB Stretch 2 reps;30 seconds  with strap bilat   ITB Stretch Limitations 2x30 sec hip adductor stretches with strap   Piriformis Stretch 3 reps;20 seconds   Piriformis Stretch  Limitations (third rep grabbing both ankles)     Lumbar Exercises: Aerobic   Stationary Bike Nustep L5 x 5.5'     Lumbar Exercises: Standing   Other Standing Lumbar Exercises Standing hip flexor stretch (lunge position) x 15 sec - switched to supine position.    Other Standing Lumbar Exercises Standing quad stretch x 30 sec x 2 reps each leg.      Lumbar Exercises: Supine   Other Supine Lumbar Exercises hip flexor stretch (thomas position) x 30 sec x 3 reps LLE, 2 reps RLE.      Moist Heat Therapy   Number Minutes Moist Heat 15 Minutes   Moist Heat Location Lumbar Spine;Hip  Lt      Acupuncturist Location Lt ant groin, Lt glute med    Printmaker Action premod to each area    Electrical Stimulation Parameters to tolerance    Electrical Stimulation Goals Tone;Pain     Manual Therapy   Manual Therapy Soft tissue mobilization;Taping   Soft tissue mobilization STW to Lt thigh adductors and QL.  Adductors were tighter proximally, distally had released.      Kinesiotix   Barrister's clerk I strip of Rock tape applied to area  of dry needling Post Lt hip to decompress tissue, increase proprioception, and aid in quick recovery.           Trigger Point Dry Needling - 05/08/16 1529    Consent Given? Yes   Education Handout Provided No   Muscles Treated Upper Body Quadratus Lumborum  Lt with good twitch response   Muscles Treated Lower Body Adductor longus/brevius/maximus   Adductor Response Palpable increased muscle length;Twitch response elicited  Lt                   PT Long Term Goals - 05/01/16 1153      PT LONG TERM GOAL #1   Title I with advanced HEP ( 05/09/16)   Status On-going     PT LONG TERM GOAL #2   Title improve FOTO =/< 22% limited ( 05/09/16)    Status On-going     PT LONG TERM GOAL #3   Title report overall reduction of pain =/> 75% to allow her to run =/> 4 miles ( 05/09/16)    Status On-going     PT  LONG TERM GOAL #4   Title tolerate high level single leg stance activites with good form ( 05/09/16)    Status On-going     PT LONG TERM GOAL #5   Title tolerate high level core exercise with good form and no back pain ( 05/09/16)    Status On-going               Plan - 05/08/16 1133    Clinical Impression Statement Pt tolerated all new stretches well.  She has had positive response to DN and taping.  Overall pain level has reduced.  Pt may benefit from education on posture and body mechanics to avoid aggrivating factors. Pt reported elimination of symptoms by end of session.    Rehab Potential Good   PT Frequency 2x / week   PT Duration 4 weeks   PT Treatment/Interventions Dry needling;Taping;Therapeutic exercise;Ultrasound;Moist Heat;Manual techniques;Neuromuscular re-education;Cryotherapy;Electrical Stimulation;Iontophoresis 4mg /ml Dexamethasone;Patient/family education   PT Next Visit Plan attempt some interval jogging.  Manual therapy/ DN/ modalities as indicated.    Consulted and Agree with Plan of Care Patient      Patient will benefit from skilled therapeutic intervention in order to improve the following deficits and impairments:  Postural dysfunction, Decreased strength, Pain, Increased muscle spasms  Visit Diagnosis: Muscle weakness (generalized)  Abnormal posture  Chronic left-sided low back pain with left-sided sciatica     Problem List Patient Active Problem List   Diagnosis Date Noted  . [place] DISEASE, CERVICAL 12/30/2006  . NEVUS, ATYPICAL 11/10/2006  . SINUSITIS, ACUTE NOS 10/23/2006  . EXCESSIVE MENSTRUATION 10/23/2006  . MYALGIA 10/23/2006  . HYPERLIPIDEMIA 12/26/2005  . DISORDER, DYSTHYMIC 12/26/2005  . PAIN IN THORACIC SPINE 12/26/2005  . ANXIETY 12/23/2005  . ALLERGIC RHINITIS 12/23/2005  . NECK PAIN 12/23/2005  . HEADACHE 12/23/2005   Kerin Perna, PTA 05/08/16 3:57 PM   Jeral Pinch, PT 05/08/16 3:57 PM   [facility] [place], [place], [address] Phone: [phone]   Fax:  [phone]  Name: Adrienne Lara MRN: [id number] Date of Birth: Apr 30, 1965

## 2016-05-14 ENCOUNTER — Ambulatory Visit (INDEPENDENT_AMBULATORY_CARE_PROVIDER_SITE_OTHER): Payer: 59 | Admitting: Physical Therapy

## 2016-05-14 ENCOUNTER — Encounter (INDEPENDENT_AMBULATORY_CARE_PROVIDER_SITE_OTHER): Payer: Self-pay

## 2016-05-14 ENCOUNTER — Encounter: Payer: Self-pay | Admitting: Physical Therapy

## 2016-05-14 DIAGNOSIS — M6281 Muscle weakness (generalized): Secondary | ICD-10-CM | POA: Diagnosis not present

## 2016-05-14 DIAGNOSIS — R293 Abnormal posture: Secondary | ICD-10-CM | POA: Diagnosis not present

## 2016-05-14 DIAGNOSIS — M5442 Lumbago with sciatica, left side: Secondary | ICD-10-CM | POA: Diagnosis not present

## 2016-05-14 DIAGNOSIS — G8929 Other chronic pain: Secondary | ICD-10-CM

## 2016-05-14 NOTE — Patient Instructions (Signed)
Abductor Strength: Side Plank Pose, on Knees - can have top leg straight    Press down with bottom knee to lift hips. Hold for __10-15__ secs. Repeat _3-5___ times each side. Build up hold times. One time a day.   Prone Plank (Eccentric)    On toes and elbows, pull abdomen in while stabilizing trunk. Slowly lower downward without arching back. _3-5__ reps, hold _15__ secs,  _1__ time a day.   Bird Dog Pose - Intermediate    Keep pelvis stable. From table pose, step one leg back to plank pose. Reach opposite arm forward, back foot off floor.  Hold for __2-3__ secs. Return arm and leg at same rate. Repeat __10__ times, each side, alternating.  Copyright  VHI. All rights reserved.

## 2016-05-14 NOTE — Therapy (Signed)
[place], [place], [address] Phone: [phone]   Fax:  [phone]  Physical Therapy Treatment  Patient Details  Name: Adrienne Lara MRN: [id number] Date of Birth: 05-Apr-1965 Referring Provider: Dr Rolena Infante  Encounter Date: 05/14/2016      PT End of Session - 05/14/16 0935    Visit Number 7   Number of Visits 14   Date for PT Re-Evaluation 06/11/16   PT Start Time 0935   PT Stop Time 1017   PT Time Calculation (min) 42 min   Activity Tolerance Patient tolerated treatment well      History reviewed. No pertinent past medical history.  History reviewed. No pertinent surgical history.  There were no vitals filed for this visit.      Subjective Assessment - 05/14/16 0936    Subjective Pt reports she has been very busy and was fine after last treatment, yesterday she had pain into her leg however she had a tooth pulled and is on pain killers now and doesn't feel anything.    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 No/denies  on pain killers for her tooth now.             [facility] PT Assessment - 05/14/16 0001      Assessment   Medical Diagnosis LBP with Lt sciatica   Referring Provider Dr Rolena Infante   Onset Date/Surgical Date 04/12/15     Observation/Other Assessments   Focus on Therapeutic Outcomes (FOTO)  41% limited, she is more aware of her deficits now, does report overall ~ 40 % improvement.      AROM   AROM Assessment Site Lumbar   Lumbar Flexion WNL  pain in the Lt groin area   Lumbar Extension WNL   Lumbar - Right Side Bend WNL   Lumbar - Left Side Bend decreased 25% with slight pain   Lumbar - Right Rotation WNL   Lumbar - Left Rotation WNL     Strength   Right Hip Extension 5/5   Right Hip ABduction 5/5    Left Hip Extension --  5-/5   Left Hip ABduction 5/5                     [facility] Adult PT Treatment/Exercise - 05/14/16 0001      Lumbar Exercises: Stretches   ITB Stretch 2 reps;30 seconds  cross body with strap, then adductor with strap, bilat   Piriformis Stretch 2 reps;30 seconds  each side     Lumbar Exercises: Aerobic   Stationary Bike Nustep L5 x 5.5'     Lumbar Exercises: Standing   Other Standing Lumbar Exercises SLS with FWD leans, 10 reps each side.      Lumbar Exercises: Prone   Other Prone Lumbar Exercises 3x15 sec plank on elbows, 3x10 sec side planks,      Lumbar Exercises: Quadruped   Opposite Arm/Leg Raise Left arm/Right leg;Right arm/Left leg;10 reps     Modalities   Modalities --  held today as she is on pain killers and has no pain                PT Education - 05/14/16 0957    Education provided Yes   Education Details advanced HEP  Person(s) Educated Patient   Methods Explanation;Handout   Comprehension Returned demonstration             PT Long Term Goals - 05/14/16 0940      PT LONG TERM GOAL #1   Title I with advanced HEP ( 06/11/16)   Time 4   Period Weeks   Status On-going     PT LONG TERM GOAL #2   Title improve FOTO =/< 22% limited ( 06/11/16)    Time 4   Period Weeks   Status On-going  41% limited, patient is more aware of her deficits.      PT LONG TERM GOAL #3   Title report overall reduction of pain =/> 75% to allow her to run =/> 4 miles ( 06/11/16)    Time 4   Period Weeks   Status On-going  not attempted running yet, 40% improvement in pain     PT LONG TERM GOAL #4   Title tolerate high level single leg stance activites with good form ( 06/11/16)    Time 4   Period Weeks   Status On-going     PT LONG TERM GOAL #5   Title tolerate high level core exercise with good form and no back pain ( 06/11/16)    Time 4   Period Weeks   Status On-going  has pain after the exercise, not during.                Plan - 05/14/16 0939    Clinical Impression Statement [name] reports she is feeling better, however is not back to all her activity. She is getting stronger in her hips and core. Still has some core weakness and difficulty with single leg stance activities.  This will be very important with return to running. Will renew for one more month and push higher level exercise and running.    Rehab Potential Good   PT Frequency 2x / week   PT Duration 4 weeks   PT Treatment/Interventions Dry needling;Taping;Therapeutic exercise;Ultrasound;Moist Heat;Manual techniques;Neuromuscular re-education;Cryotherapy;Electrical Stimulation;Iontophoresis 4mg /ml Dexamethasone;Patient/family education   PT Next Visit Plan attempt some interval jogging, not tried last visit as she was on pain killers for her tooth   Consulted and Agree with Plan of Care Patient      Patient will benefit from skilled therapeutic intervention in order to improve the following deficits and impairments:  Postural dysfunction, Decreased strength, Pain, Increased muscle spasms  Visit Diagnosis: Muscle weakness (generalized) - Plan: PT plan of care cert/re-cert  Abnormal posture - Plan: PT plan of care cert/re-cert  Chronic left-sided low back pain with left-sided sciatica - Plan: PT plan of care cert/re-cert     Problem List Patient Active Problem List   Diagnosis Date Noted  . [place] DISEASE, CERVICAL 12/30/2006  . NEVUS, ATYPICAL 11/10/2006  . SINUSITIS, ACUTE NOS 10/23/2006  . EXCESSIVE MENSTRUATION 10/23/2006  . MYALGIA 10/23/2006  . HYPERLIPIDEMIA 12/26/2005  . DISORDER, DYSTHYMIC 12/26/2005  . PAIN IN THORACIC SPINE 12/26/2005  . ANXIETY 12/23/2005  . ALLERGIC RHINITIS 12/23/2005  . NECK PAIN 12/23/2005  . HEADACHE 12/23/2005    Jeral Pinch PT  05/14/2016, 10:19 AM  [facility] [place], [place], [address] Phone: [phone]    Fax:  [phone]  Name: DONIKA BUTNER MRN: [id number] Date of Birth: November 21, 1965

## 2016-05-16 ENCOUNTER — Encounter: Payer: 59 | Admitting: Physical Therapy

## 2016-05-20 ENCOUNTER — Encounter: Payer: Self-pay | Admitting: Physical Therapy

## 2016-05-20 ENCOUNTER — Ambulatory Visit (INDEPENDENT_AMBULATORY_CARE_PROVIDER_SITE_OTHER): Payer: 59 | Admitting: Physical Therapy

## 2016-05-20 DIAGNOSIS — G8929 Other chronic pain: Secondary | ICD-10-CM

## 2016-05-20 DIAGNOSIS — M5442 Lumbago with sciatica, left side: Secondary | ICD-10-CM | POA: Diagnosis not present

## 2016-05-20 DIAGNOSIS — R293 Abnormal posture: Secondary | ICD-10-CM | POA: Diagnosis not present

## 2016-05-20 DIAGNOSIS — M6281 Muscle weakness (generalized): Secondary | ICD-10-CM | POA: Diagnosis not present

## 2016-05-20 NOTE — Patient Instructions (Addendum)
    Jogging intervals, begin with walking warm up. Then perform 2-3 miles. Jogging 30 sec, walk 60 sec.  Do this for 4-5 days of running.  If doing well start increasing your jogging time.  ie 40-45 sec, 60 sec walk.  Build up running time, keeping jogging time until you can complete ~ 6-7 minutes of jogging. Then start decreasing your walk time.   Use app on phone to help with this. ( I used Runkeeper)   Trunk Extension: Bridge, Single Leg (Eccentric) - can also perform bridge with one leg straight up, other on the floor.  Bridge with ball - both feet on it.     Lie on back, feet on the floor. Cross one leg on other. Lift hips. Squeeze buttocks. Slowly lower hips for 3-5 seconds. _8-10__ reps per set, _2-3__ sets per day, __7_ days per week. Repeat on the other leg.  Copyright  VHI. All rights reserved.

## 2016-05-20 NOTE — Therapy (Signed)
Adrienne Lara, [place], [address] Phone: [phone]   Fax:  [phone]  Physical Therapy Treatment  Patient Details  Name: Adrienne Lara MRN: [id number] Date of Birth: [birth year] Referring Provider: Dr Rolena Infante  Encounter Date: 05/20/2016      PT End of Session - 05/20/16 1018    Visit Number 8   Number of Visits 14   Date for PT Re-Evaluation 06/11/16   PT Start Time 1018   PT Stop Time 1118   PT Time Calculation (min) 60 min   Activity Tolerance Patient tolerated treatment well      History reviewed. No pertinent past medical history.  History reviewed. No pertinent surgical history.  There were no vitals filed for this visit.      Subjective Assessment - 05/20/16 1018    Subjective Doing well with her back, no pain,  had a dry socket in her tooth and had to return to the MD for treatment.    Patient Stated Goals 15-20 mile of running a week, return to weight training ( free wts and machines)    Currently in Pain? No/denies                         [facility] Adult PT Treatment/Exercise - 05/20/16 0001      Lumbar Exercises: Stretches   Active Hamstring Stretch 1 rep  45 sec in standing.    Quad Stretch 1 rep  45 sec each side in standing   Quad Stretch Limitations gastroc stretch against wall   ITB Stretch 1 rep  45 sec each side, standing   Piriformis Stretch 1 rep  45 sec in standing, VC for form     Lumbar Exercises: Aerobic   Tread Mill interval walking/running, VC to engage her core with running, speeds varying from 2.5-4.5, total 15 minutes.  had some tightness/pressure in the Lt low back with jogging     Lumbar Exercises: Supine   Bridge 20 reps  single leg bridges   Bridge Limitations feet on ball bridging      Lumbar Exercises: Sidelying   Hip Abduction 10 reps  each side with arch, foot taps FWD/BWD     Modalities   Modalities Electrical  Stimulation;Moist Heat     Moist Heat Therapy   Number Minutes Moist Heat 15 Minutes   Moist Heat Location --  Lt hip adductors     Electrical Stimulation   Electrical Stimulation Location Lt hip adductors   Electrical Stimulation Action IFC   Electrical Stimulation Parameters to tolerance   Electrical Stimulation Goals Tone;Pain     Manual Therapy   Manual Therapy Soft tissue mobilization;Taping   Soft tissue mobilization Lt hip addcutor STM          Trigger Point Dry Needling - 05/20/16 1051    Consent Given? Yes   Education Handout Provided No   Muscles Treated Lower Body Adductor longus/brevius/maximus   Adductor Response Palpable increased muscle length;Twitch response elicited  Lt with stim              PT Education - 05/20/16 1032    Education provided Yes   Education Details interval training to return to running   Person(s) Educated Patient   Methods Explanation;Handout   Comprehension Verbalized understanding;Returned demonstration             PT Long Term Goals - 05/14/16 0940      PT  LONG TERM GOAL #1   Title I with advanced HEP ( 06/11/16)   Time 4   Period Weeks   Status On-going     PT LONG TERM GOAL #2   Title improve FOTO =/< 22% limited ( 06/11/16)    Time 4   Period Weeks   Status On-going  41% limited, patient is more aware of her deficits.      PT LONG TERM GOAL #3   Title report overall reduction of pain =/> 75% to allow her to run =/> 4 miles ( 06/11/16)    Time 4   Period Weeks   Status On-going  not attempted running yet, 40% improvement in pain     PT LONG TERM GOAL #4   Title tolerate high level single leg stance activites with good form ( 06/11/16)    Time 4   Period Weeks   Status On-going     PT LONG TERM GOAL #5   Title tolerate high level core exercise with good form and no back pain ( 06/11/16)    Time 4   Period Weeks   Status On-going  has pain after the exercise, not during.               Plan  - 05/20/16 1036    Clinical Impression Statement Adrienne Lara tolerated running on the treadmill well, however she reports her pain usually comes later.  Doing well with core work and continues to progress to goals.  Issued education on progressing her jogging.    Rehab Potential Good   PT Frequency 2x / week   PT Duration 4 weeks   PT Treatment/Interventions Dry needling;Taping;Therapeutic exercise;Ultrasound;Moist Heat;Manual techniques;Neuromuscular re-education;Cryotherapy;Electrical Stimulation;Iontophoresis 4mg /ml Dexamethasone;Patient/family education   PT Next Visit Plan assess how intervall running is progressing.    Consulted and Agree with Plan of Care Patient      Patient will benefit from skilled therapeutic intervention in order to improve the following deficits and impairments:  Postural dysfunction, Decreased strength, Pain, Increased muscle spasms  Visit Diagnosis: Muscle weakness (generalized)  Abnormal posture  Chronic left-sided low back pain with left-sided sciatica     Problem List Patient Active Problem List   Diagnosis Date Noted  . [place] DISEASE, CERVICAL 12/30/2006  . NEVUS, ATYPICAL 11/10/2006  . SINUSITIS, ACUTE NOS 10/23/2006  . EXCESSIVE MENSTRUATION 10/23/2006  . MYALGIA 10/23/2006  . HYPERLIPIDEMIA 12/26/2005  . DISORDER, DYSTHYMIC 12/26/2005  . PAIN IN THORACIC SPINE 12/26/2005  . ANXIETY 12/23/2005  . ALLERGIC RHINITIS 12/23/2005  . NECK PAIN 12/23/2005  . HEADACHE 12/23/2005    Jeral Pinch PT  05/20/2016, 11:11 AM  [facility] [place], [place], [address] Phone: [phone]   Fax:  [phone]  Name: Adrienne Lara MRN: [id number] Date of Birth: 05/29/1965

## 2016-05-21 ENCOUNTER — Encounter: Payer: Self-pay | Admitting: Physical Therapy

## 2016-05-21 ENCOUNTER — Ambulatory Visit (INDEPENDENT_AMBULATORY_CARE_PROVIDER_SITE_OTHER): Payer: 59 | Admitting: Physical Therapy

## 2016-05-21 DIAGNOSIS — R293 Abnormal posture: Secondary | ICD-10-CM | POA: Diagnosis not present

## 2016-05-21 DIAGNOSIS — M6281 Muscle weakness (generalized): Secondary | ICD-10-CM | POA: Diagnosis not present

## 2016-05-21 DIAGNOSIS — M5442 Lumbago with sciatica, left side: Secondary | ICD-10-CM

## 2016-05-21 DIAGNOSIS — G8929 Other chronic pain: Secondary | ICD-10-CM

## 2016-05-21 NOTE — Therapy (Signed)
[place], [place], [address] Phone: [phone]   Fax:  [phone]  Physical Therapy Treatment  Patient Details  Name: Adrienne Lara MRN: [id number] Date of Birth: 1966-01-14 Referring Provider: Dr Rolena Infante  Encounter Date: 05/21/2016      PT End of Session - 05/21/16 1106    Visit Number 9   Number of Visits 14   Date for PT Re-Evaluation 06/11/16   PT Start Time 1106   PT Stop Time 1145   PT Time Calculation (min) 39 min   Activity Tolerance Patient tolerated treatment well      History reviewed. No pertinent past medical history.  History reviewed. No pertinent surgical history.  There were no vitals filed for this visit.      Subjective Assessment - 05/21/16 1107    Subjective Adrienne Lara reports no increase in her pain after interval running yesterday   Patient Stated Goals 15-20 mile of running a week, return to weight training ( free wts and machines)    Currently in Pain? No/denies                         [facility] Adult PT Treatment/Exercise - 05/21/16 0001      Lumbar Exercises: Stretches   Active Hamstring Stretch 1 rep   Quad Stretch 1 rep   Quad Stretch Limitations seated hip flexor stretch   ITB Stretch 1 rep   ITB Stretch Limitations hip adduction stretch  with side lunge   Piriformis Stretch 1 rep     Lumbar Exercises: Aerobic   Tread Mill interval walking/running, VC to engage her core with running, speeds varying from 2.5-4.5, total 15 minutes.  slight Lt groin pressure     Lumbar Exercises: Supine   Other Supine Lumbar Exercises 2x10 leg press out of table top with blue band on feet.      Lumbar Exercises: Prone   Other Prone Lumbar Exercises 3x5 prone plank, windshield whipers legs     Lumbar Exercises: Quadruped   Straight Leg Raises Limitations single leg kick backs with blue band around feet.           Trigger Point Dry Needling -  05/20/16 1051    Consent Given? Yes   Education Handout Provided No   Muscles Treated Lower Body Adductor longus/brevius/maximus   Adductor Response Palpable increased muscle length;Twitch response elicited  Lt with stim              PT Education - 05/20/16 1032    Education provided Yes   Education Details interval training to return to running   Person(s) Educated Patient   Methods Explanation;Handout   Comprehension Verbalized understanding;Returned demonstration             PT Long Term Goals - 05/14/16 0940      PT LONG TERM GOAL #1   Title I with advanced HEP ( 06/11/16)   Time 4   Period Weeks   Status On-going     PT LONG TERM GOAL #2   Title improve FOTO =/< 22% limited ( 06/11/16)    Time 4   Period Weeks   Status On-going  41% limited, patient is more aware of her deficits.      PT LONG TERM GOAL #3   Title report overall reduction of pain =/> 75% to allow her to run =/> 4 miles ( 06/11/16)    Time 4  Period Weeks   Status On-going  not attempted running yet, 40% improvement in pain     PT LONG TERM GOAL #4   Title tolerate high level single leg stance activites with good form ( 06/11/16)    Time 4   Period Weeks   Status On-going     PT LONG TERM GOAL #5   Title tolerate high level core exercise with good form and no back pain ( 06/11/16)    Time 4   Period Weeks   Status On-going  has pain after the exercise, not during.               Plan - 05/21/16 1147    Clinical Impression Statement Adrienne Lara is very weak in her core and hips with functional movements.  She will require more strength to be safe with return to running. She is going to build up to 40 sec jogging intervals before she returns to therapy.    Rehab Potential Good   PT Frequency 2x / week   PT Duration 4 weeks   PT Treatment/Interventions Dry needling;Taping;Therapeutic exercise;Ultrasound;Moist Heat;Manual techniques;Neuromuscular re-education;Cryotherapy;Electrical  Stimulation;Iontophoresis 4mg /ml Dexamethasone;Patient/family education   PT Next Visit Plan assess how intervall running is progressing.    Consulted and Agree with Plan of Care Patient      Patient will benefit from skilled therapeutic intervention in order to improve the following deficits and impairments:  Postural dysfunction, Decreased strength, Pain, Increased muscle spasms  Visit Diagnosis: Muscle weakness (generalized)  Abnormal posture  Chronic left-sided low back pain with left-sided sciatica     Problem List Patient Active Problem List   Diagnosis Date Noted  . [place] DISEASE, CERVICAL 12/30/2006  . NEVUS, ATYPICAL 11/10/2006  . SINUSITIS, ACUTE NOS 10/23/2006  . EXCESSIVE MENSTRUATION 10/23/2006  . MYALGIA 10/23/2006  . HYPERLIPIDEMIA 12/26/2005  . DISORDER, DYSTHYMIC 12/26/2005  . PAIN IN THORACIC SPINE 12/26/2005  . ANXIETY 12/23/2005  . ALLERGIC RHINITIS 12/23/2005  . NECK PAIN 12/23/2005  . HEADACHE 12/23/2005    Adrienne Lara,Adrienne Lara 05/21/2016, 11:52 AM  [facility] [place], [place], [address] Phone: [phone]   Fax:  [phone]  Name: Adrienne Lara MRN: [id number] Date of Birth: 01/02/1966

## 2016-05-21 NOTE — Therapy (Deleted)
[place], [place], [address] Phone: [phone]   Fax:  [phone]  Physical Therapy Treatment  Patient Details  Name: Adrienne Lara MRN: [id number] Date of Birth: Mar 11, 1965 Referring Provider: Dr Rolena Infante  Encounter Date: 05/21/2016      PT End of Session - 05/21/16 1106    Visit Number 9   Number of Visits 14   Date for PT Re-Evaluation 06/11/16   PT Start Time 1106   PT Stop Time 1145   PT Time Calculation (min) 39 min   Activity Tolerance Patient tolerated treatment well      History reviewed. No pertinent past medical history.  History reviewed. No pertinent surgical history.  There were no vitals filed for this visit.      Subjective Assessment - 05/21/16 1107    Subjective Liza reports no increase in her pain after interval running yesterday   Patient Stated Goals 15-20 mile of running a week, return to weight training ( free wts and machines)    Currently in Pain? No/denies                         [facility] Adult PT Treatment/Exercise - 05/21/16 0001      Lumbar Exercises: Stretches   Active Hamstring Stretch 1 rep   Quad Stretch 1 rep   Quad Stretch Limitations seated hip flexor stretch   ITB Stretch 1 rep   ITB Stretch Limitations hip adduction stretch  with side lunge   Piriformis Stretch 1 rep     Lumbar Exercises: Aerobic   Tread Mill interval walking/running, VC to engage her core with running, speeds varying from 2.5-4.5, total 15 minutes.  slight Lt groin pressure     Lumbar Exercises: Supine   Other Supine Lumbar Exercises 2x10 leg press out of table top with blue band on feet.      Lumbar Exercises: Prone   Other Prone Lumbar Exercises 3x5 prone plank, windshield whipers legs     Lumbar Exercises: Quadruped   Straight Leg Raises Limitations single leg kick backs with blue band around feet.           Trigger Point Dry Needling -  05/20/16 1051    Consent Given? Yes   Education Handout Provided No   Muscles Treated Lower Body Adductor longus/brevius/maximus   Adductor Response Palpable increased muscle length;Twitch response elicited  Lt with stim              PT Education - 05/20/16 1032    Education provided Yes   Education Details interval training to return to running   Person(s) Educated Patient   Methods Explanation;Handout   Comprehension Verbalized understanding;Returned demonstration             PT Long Term Goals - 05/14/16 0940      PT LONG TERM GOAL #1   Title I with advanced HEP ( 06/11/16)   Time 4   Period Weeks   Status On-going     PT LONG TERM GOAL #2   Title improve FOTO =/< 22% limited ( 06/11/16)    Time 4   Period Weeks   Status On-going  41% limited, patient is more aware of her deficits.      PT LONG TERM GOAL #3   Title report overall reduction of pain =/> 75% to allow her to run =/> 4 miles ( 06/11/16)    Time 4  Period Weeks   Status On-going  not attempted running yet, 40% improvement in pain     PT LONG TERM GOAL #4   Title tolerate high level single leg stance activites with good form ( 06/11/16)    Time 4   Period Weeks   Status On-going     PT LONG TERM GOAL #5   Title tolerate high level core exercise with good form and no back pain ( 06/11/16)    Time 4   Period Weeks   Status On-going  has pain after the exercise, not during.               Plan - 05/20/16 1036    Clinical Impression Statement Liza tolerated running on the treadmill well, however she reports her pain usually comes later.  Doing well with core work and continues to progress to goals.  Issued education on progressing her jogging.    Rehab Potential Good   PT Frequency 2x / week   PT Duration 4 weeks   PT Treatment/Interventions Dry needling;Taping;Therapeutic exercise;Ultrasound;Moist Heat;Manual techniques;Neuromuscular re-education;Cryotherapy;Electrical  Stimulation;Iontophoresis 4mg /ml Dexamethasone;Patient/family education   PT Next Visit Plan assess how intervall running is progressing.    Consulted and Agree with Plan of Care Patient      Patient will benefit from skilled therapeutic intervention in order to improve the following deficits and impairments:     Visit Diagnosis: Muscle weakness (generalized)  Abnormal posture  Chronic left-sided low back pain with left-sided sciatica     Problem List Patient Active Problem List   Diagnosis Date Noted  . [place] DISEASE, CERVICAL 12/30/2006  . NEVUS, ATYPICAL 11/10/2006  . SINUSITIS, ACUTE NOS 10/23/2006  . EXCESSIVE MENSTRUATION 10/23/2006  . MYALGIA 10/23/2006  . HYPERLIPIDEMIA 12/26/2005  . DISORDER, DYSTHYMIC 12/26/2005  . PAIN IN THORACIC SPINE 12/26/2005  . ANXIETY 12/23/2005  . ALLERGIC RHINITIS 12/23/2005  . NECK PAIN 12/23/2005  . HEADACHE 12/23/2005    Jeral Pinch PT  05/21/2016, 11:46 AM  [facility] [place], [place], [address] Phone: [phone]   Fax:  [phone]  Name: ARTHELLA HEADINGS MRN: [id number] Date of Birth: [birth year]

## 2016-05-23 ENCOUNTER — Encounter: Payer: 59 | Admitting: Physical Therapy

## 2016-05-29 ENCOUNTER — Encounter: Payer: 59 | Admitting: Physical Therapy

## 2016-06-04 ENCOUNTER — Ambulatory Visit (INDEPENDENT_AMBULATORY_CARE_PROVIDER_SITE_OTHER): Payer: 59 | Admitting: Physical Therapy

## 2016-06-04 ENCOUNTER — Encounter: Payer: 59 | Admitting: Physical Therapy

## 2016-06-04 DIAGNOSIS — R293 Abnormal posture: Secondary | ICD-10-CM | POA: Diagnosis not present

## 2016-06-04 DIAGNOSIS — G8929 Other chronic pain: Secondary | ICD-10-CM | POA: Diagnosis not present

## 2016-06-04 DIAGNOSIS — M5442 Lumbago with sciatica, left side: Secondary | ICD-10-CM | POA: Diagnosis not present

## 2016-06-04 DIAGNOSIS — M6281 Muscle weakness (generalized): Secondary | ICD-10-CM | POA: Diagnosis not present

## 2016-06-04 NOTE — Patient Instructions (Addendum)
Knee Fold    Lie on back, legs bent, arms by sides. Exhale, lifting knee to chest. Inhale, returning. Keep abdominals flat, navel to spine. Repeat _10___ times, alternating legs. Do _1___ sessions per day.   [facility] [place], [place] [address]  [phone] (office) [phone] (fax)

## 2016-06-04 NOTE — Therapy (Signed)
Adrienne Lara, [place], [address] Phone: [phone]   Fax:  [phone]  Physical Therapy Treatment  Patient Details  Name: Adrienne Lara MRN: [id number] Date of Birth: [birth year] Referring Provider: Dr. Rolena Infante  Encounter Date: 06/04/2016      PT End of Session - 06/04/16 1030    Visit Number 10   Number of Visits 14   Date for PT Re-Evaluation 06/11/16   PT Start Time 9326   PT Stop Time 1104   PT Time Calculation (min) 46 min   Activity Tolerance Patient tolerated treatment well;No increased pain   Behavior During Therapy [facility] for tasks assessed/performed      No past medical history on file.  No past surgical history on file.  There were no vitals filed for this visit.      Subjective Assessment - 06/04/16 1031    Subjective Adrienne Lara reports she ran 4 times, distance of 3 miles each time.  Walk/jog method.  No pain during or after.  She has been performing core exercises every day.     Currently in Pain? No/denies   Pain Score 0-No pain            [facility] PT Assessment - 06/04/16 0001      Assessment   Medical Diagnosis LBP with Lt sciatica   Referring Provider Dr. Rolena Infante   Onset Date/Surgical Date 04/12/15   Next MD Visit 06/2016          [facility] Adult PT Treatment/Exercise - 06/04/16 0001      Exercises   Exercises Knee/Hip;Lumbar     Lumbar Exercises: Stretches   Passive Hamstring Stretch 2 reps;30 seconds   Quad Stretch 3 reps;20 seconds   ITB Stretch 2 reps;20 seconds  standing, legs crossed with trunk forward bend.    ITB Stretch Limitations hip adduction stretch  with side lunge   Piriformis Stretch 3 reps;10 seconds     Lumbar Exercises: Aerobic   Tread Mill interval walking/jogging, VC to engage her core with running, (jog 17mn, walk 30 sec) speeds varying from 2.5-4.5, total 10 minutes.  slight Lt groin pressure     Lumbar Exercises: Supine   Other Supine  Lumbar Exercises pilates table top heel taps x 10 x 2 sets     Lumbar Exercises: Sidelying   Other Sidelying Lumbar Exercises side plank (modified leg position, x 10 sec x 2 reps Lt side, up to 20 sec, 2 reps on Rt side.      Lumbar Exercises: Prone   Other Prone Lumbar Exercises 5 reps prone plank, windshield wipers legs; plank with spider legs (hip abd, knee flexion) x 4 reps each side x 2 sets     Knee/Hip Exercises: Stretches   Piriformis Stretch Right;Left;2 reps   Gastroc Stretch Right;Left;2 reps;30 seconds   Soleus Stretch Right;Left;1 rep;10 seconds     Knee/Hip Exercises: Standing   Side Lunges Right;Left  trial, leg slide out to side on towel.    Side Lunges Limitations Lt knee pain; stopped.    SLS SLS forward leans to 8" step x 5 reps, 2 sets each side.                 PT Education - 06/04/16 1122    Education provided Yes   Education Details HEP: added pilates table top heel taps for core. Changed walk:jog to 30 sec walk/1 min -1.5 min jog.    Person(s) Educated Patient  Methods Explanation;Handout;Demonstration;Verbal cues   Comprehension Verbalized understanding;Returned demonstration             PT Long Term Goals - 06/04/16 1102      PT LONG TERM GOAL #1   Title I with advanced HEP ( 06/11/16)   Time 4   Period Weeks     PT LONG TERM GOAL #2   Title improve FOTO =/< 22% limited ( 06/11/16)    Time 4   Period Weeks   Status On-going     PT LONG TERM GOAL #3   Title report overall reduction of pain =/> 75% to allow her to run =/> 4 miles ( 06/11/16)    Time 4   Period Weeks   Status --  60% pain reduction reported with running.      PT LONG TERM GOAL #4   Title tolerate high level single leg stance activites with good form ( 06/11/16)    Time 4   Period Weeks   Status On-going     PT LONG TERM GOAL #5   Title tolerate high level core exercise with good form and no back pain ( 06/11/16)    Time 4   Period Weeks   Status Partially Met   progressing               Plan - 06/04/16 1038    Clinical Impression Statement Pt tolerated increased jog time vs walk time with interval running today with no symptoms.  She is fearful of relapse with increased run time outside of therapy.   She tolerated all execises except single leg squat with opp leg abdct - this caused increased knee pain (stopped after 3-5 reps).  Pt reporting less overall pain; progressing towards LTG # 3.  Making good gains towards all other goals.    Rehab Potential Good   PT Frequency 2x / week   PT Duration 4 weeks   PT Treatment/Interventions Dry needling;Taping;Therapeutic exercise;Ultrasound;Moist Heat;Manual techniques;Neuromuscular re-education;Cryotherapy;Electrical Stimulation;Iontophoresis 69m/ml Dexamethasone;Patient/family education   PT Next Visit Plan assess how intervall running is progressing. FOTO.Rhett Bannistergoals and need for additional visits vs d/c to HEP.    Consulted and Agree with Plan of Care Patient      Patient will benefit from skilled therapeutic intervention in order to improve the following deficits and impairments:  Postural dysfunction, Decreased strength, Pain, Increased muscle spasms  Visit Diagnosis: Muscle weakness (generalized)  Abnormal posture  Chronic left-sided low back pain with left-sided sciatica     Problem List Patient Active Problem List   Diagnosis Date Noted  . [place], CERVICAL 12/30/2006  . NEVUS, ATYPICAL 11/10/2006  . SINUSITIS, ACUTE NOS 10/23/2006  . EXCESSIVE MENSTRUATION 10/23/2006  . MYALGIA 10/23/2006  . HYPERLIPIDEMIA 12/26/2005  . DISORDER, DYSTHYMIC 12/26/2005  . PAIN IN THORACIC SPINE 12/26/2005  . ANXIETY 12/23/2005  . ALLERGIC RHINITIS 12/23/2005  . NECK PAIN 12/23/2005  . HEADACHE 12/23/2005   JKerin Perna PTA 06/04/16 11:33 AM  [place] [address]Phone:  [phone]  Fax:  [phone] Name: EYEVONNE YOKUMMRN: [id number] of Birth: [birth year]

## 2016-06-11 ENCOUNTER — Encounter: Payer: 59 | Admitting: Physical Therapy

## 2016-06-13 ENCOUNTER — Encounter: Payer: 59 | Admitting: Physical Therapy

## 2016-06-20 ENCOUNTER — Ambulatory Visit (INDEPENDENT_AMBULATORY_CARE_PROVIDER_SITE_OTHER): Payer: 59 | Admitting: Physical Therapy

## 2016-06-20 DIAGNOSIS — M6281 Muscle weakness (generalized): Secondary | ICD-10-CM

## 2016-06-20 DIAGNOSIS — G8929 Other chronic pain: Secondary | ICD-10-CM | POA: Diagnosis not present

## 2016-06-20 DIAGNOSIS — M5442 Lumbago with sciatica, left side: Secondary | ICD-10-CM | POA: Diagnosis not present

## 2016-06-20 DIAGNOSIS — R293 Abnormal posture: Secondary | ICD-10-CM | POA: Diagnosis not present

## 2016-06-20 NOTE — Therapy (Addendum)
[facility] [address] [place], , [address] Phone: [phone]   Fax:  [phone]  Physical Therapy Treatment  Patient Details  Name: Mianna C Wisener MRN: [id number] Date of Birth: 09/27/1965 Referring Provider: Dr. Brooks  Encounter Date: 06/20/2016      PT End of Session - 06/20/16 1452    Visit Number 11   Number of Visits 14   PT Start Time 1412   PT Stop Time 1450   PT Time Calculation (min) 38 min   Activity Tolerance Patient tolerated treatment well;No increased pain   Behavior During Therapy WFL for tasks assessed/performed      No past medical history on file.  No past surgical history on file.  There were no vitals filed for this visit.      Subjective Assessment - 06/20/16 1418    Subjective Liza reports she had to travel for work last week, followed by vacation to [place]; a lot driving.  She feels discouraged, like she has had a set back.  She feels tightness in her Lt groin, lateral hip; tender to touch.  She did a lot of stretching after trip. She walked 3 miles yesterday and this seemed to loosen things up.    Patient Stated Goals 15-20 mile of running a week, return to weight training ( free wts and machines)    Currently in Pain? No/denies   Pain Score 0-No pain            [facility] PT Assessment - 06/20/16 0001      Observation/Other Assessments   Focus on Therapeutic Outcomes (FOTO)  39% limited         [facility] Adult PT Treatment/Exercise - 06/20/16 0001      Lumbar Exercises: Stretches   Passive Hamstring Stretch 2 reps;30 seconds   Quad Stretch 3 reps;20 seconds   ITB Stretch 2 reps;30 seconds  supine with strap   ITB Stretch Limitations hip adductor stretch supine with strap x 2 reps each leg.    Piriformis Stretch 3 reps;10 seconds     Lumbar Exercises: Aerobic   Tread Mill interval walking/jogging, VC to engage her core with running, (jog 1min, walk 30 sec) speeds  varying from 3.2-4.0, total 10 minutes.  slight Lt groin pressure     Lumbar Exercises: Sidelying   Other Sidelying Lumbar Exercises side plank with trunk on ball, side crunch x 5 reps each side.      Lumbar Exercises: Prone   Other Prone Lumbar Exercises Prone ball walk out to push ups x 10 reps (ball at thighs); Opp arm/leg raise x 5 reps each    Other Prone Lumbar Exercises Reviewed prone core exercises via demo and VC with therapy ball (quadruped)     Knee/Hip Exercises: Standing   Forward Lunges Right;Left;5 reps  with trunk twist    SLS SLS forward leans to 6" step x 5 reps, 2 sets each side.      Modalities   Modalities --  pt declined     Manual Therapy   Manual Therapy --  pt declined          PT Long Term Goals - 06/20/16 1507      PT LONG TERM GOAL #1   Title I with advanced HEP ( 06/11/16)   Time 4   Period Weeks   Status Achieved     PT LONG TERM GOAL #2   Title improve FOTO =/< 22% limited ( 06/11/16)      Time 4   Period Weeks   Status Not Met     PT LONG TERM GOAL #3   Title report overall reduction of pain =/> 75% to allow her to run =/> 4 miles ( 06/11/16)    Time 4   Status Partially Met     PT LONG TERM GOAL #4   Title tolerate high level single leg stance activites with good form ( 06/11/16)    Time 4   Period Weeks   Status Achieved     PT LONG TERM GOAL #5   Title tolerate high level core exercise with good form and no back pain ( 06/11/16)    Time 4   Period Weeks   Status Achieved               Plan - 06/20/16 1424    Clinical Impression Statement Pt was able to tolerate interval walk/jog up to 5.0mph with no symptoms.  She tolerated all other exercises well without any production of symptoms. HEP exercises were verbally reviewed.  She voiced readiness to d/c to HEP after today's session.  Pt has partially met her goals.    Rehab Potential Good   PT Frequency 2x / week   PT Duration 4 weeks   PT Treatment/Interventions Dry  needling;Taping;Therapeutic exercise;Ultrasound;Moist Heat;Manual techniques;Neuromuscular re-education;Cryotherapy;Electrical Stimulation;Iontophoresis 4mg/ml Dexamethasone;Patient/family education   PT Next Visit Plan Spoke to supervising PT; will d/c to HEP per pt request.       Patient will benefit from skilled therapeutic intervention in order to improve the following deficits and impairments:  Postural dysfunction, Decreased strength, Pain, Increased muscle spasms  Visit Diagnosis: Muscle weakness (generalized)  Abnormal posture  Chronic left-sided low back pain with left-sided sciatica     Problem List Patient Active Problem List   Diagnosis Date Noted  . DISC DISEASE, CERVICAL 12/30/2006  . NEVUS, ATYPICAL 11/10/2006  . SINUSITIS, ACUTE NOS 10/23/2006  . EXCESSIVE MENSTRUATION 10/23/2006  . MYALGIA 10/23/2006  . HYPERLIPIDEMIA 12/26/2005  . DISORDER, DYSTHYMIC 12/26/2005  . PAIN IN THORACIC SPINE 12/26/2005  . ANXIETY 12/23/2005  . ALLERGIC RHINITIS 12/23/2005  . NECK PAIN 12/23/2005  . HEADACHE 12/23/2005   Jennifer Carlson-Long, PTA 06/20/16 3:17 PM  [facility] [address] [place], [place], [address] Phone: [phone]   Fax:  [phone]  Name: Adrine C Kelm MRN: [id number] Date of Birth: 06/23/1965  PHYSICAL THERAPY DISCHARGE SUMMARY  Visits from Start of Care: 11  Current functional level related to goals / functional outcomes: See above   Remaining deficits: See above   Education / Equipment: HEP Plan: Patient agrees to discharge.  Patient goals were partially met. Patient is being discharged due to being pleased with the current functional level.  ?????     Susan Shaver, PT 07/10/16 7:51 AM  

## 2017-08-25 ENCOUNTER — Other Ambulatory Visit: Payer: Self-pay | Admitting: Physician Assistant

## 2017-08-25 ENCOUNTER — Encounter: Payer: Self-pay | Admitting: Physician Assistant

## 2017-08-25 ENCOUNTER — Ambulatory Visit (INDEPENDENT_AMBULATORY_CARE_PROVIDER_SITE_OTHER): Payer: 59 | Admitting: Physician Assistant

## 2017-08-25 VITALS — BP 133/88 | HR 73 | Ht 63.0 in | Wt 168.0 lb

## 2017-08-25 DIAGNOSIS — Z7689 Persons encountering health services in other specified circumstances: Secondary | ICD-10-CM

## 2017-08-25 DIAGNOSIS — J3089 Other allergic rhinitis: Secondary | ICD-10-CM

## 2017-08-25 DIAGNOSIS — N911 Secondary amenorrhea: Secondary | ICD-10-CM | POA: Diagnosis not present

## 2017-08-25 DIAGNOSIS — R030 Elevated blood-pressure reading, without diagnosis of hypertension: Secondary | ICD-10-CM

## 2017-08-25 DIAGNOSIS — Z803 Family history of malignant neoplasm of breast: Secondary | ICD-10-CM | POA: Diagnosis not present

## 2017-08-25 DIAGNOSIS — Z1231 Encounter for screening mammogram for malignant neoplasm of breast: Secondary | ICD-10-CM

## 2017-08-25 DIAGNOSIS — R635 Abnormal weight gain: Secondary | ICD-10-CM

## 2017-08-25 DIAGNOSIS — F419 Anxiety disorder, unspecified: Secondary | ICD-10-CM

## 2017-08-25 DIAGNOSIS — G43109 Migraine with aura, not intractable, without status migrainosus: Secondary | ICD-10-CM | POA: Insufficient documentation

## 2017-08-25 DIAGNOSIS — Z13220 Encounter for screening for lipoid disorders: Secondary | ICD-10-CM

## 2017-08-25 DIAGNOSIS — Z130 Encounter for screening for diseases of the blood and blood-forming organs and certain disorders involving the immune mechanism: Secondary | ICD-10-CM

## 2017-08-25 DIAGNOSIS — F411 Generalized anxiety disorder: Secondary | ICD-10-CM | POA: Insufficient documentation

## 2017-08-25 DIAGNOSIS — Z1239 Encounter for other screening for malignant neoplasm of breast: Secondary | ICD-10-CM

## 2017-08-25 DIAGNOSIS — Z6829 Body mass index (BMI) 29.0-29.9, adult: Secondary | ICD-10-CM

## 2017-08-25 DIAGNOSIS — Z1329 Encounter for screening for other suspected endocrine disorder: Secondary | ICD-10-CM

## 2017-08-25 MED ORDER — TOPIRAMATE 25 MG PO TABS
ORAL_TABLET | ORAL | 0 refills | Status: DC
Start: 2017-08-25 — End: 2017-08-25

## 2017-08-25 MED ORDER — DULOXETINE HCL 20 MG PO CPEP: ORAL_CAPSULE | ORAL | 0 refills | Status: DC

## 2017-08-25 MED ORDER — MONTELUKAST SODIUM 10 MG PO TABS: 10.0000 mg | ORAL_TABLET | Freq: Every day | ORAL | 3 refills | Status: DC

## 2017-08-25 NOTE — Progress Notes (Signed)
HPI:                                                                Adrienne Lara is a 52 y.o. female who presents to [facility] today to establish care  Current concerns: menopause, anxiety, weight gain  LMP unknown, has had Mirena IUD for 6.5 years. Denies vasomotor symptoms or nightsweats. Would like to be checked for menopause due to weight gain.  Reports 30 pound weight gain over 2 years. Feels very frustrated by this. During this time, had a back injury and was unable to exercise (former marathon runner). Also discontinued Topamax (for migraines) during this time. She is interested in re-starting Topamax to help with weight. Has <1 migraine per month, relieved with Ibuprofen prn. Reports aura of "white dots" preceding her migraines.  Reports severe anxiety and struggling to cope with life stressors. Reports multiple life stressors - daughter going to college, husband having health issues, stressful job  Denies depressed mood/anhedonia. Has a long-standing history of GAD. Prior medication include: Cymbalta and Lexapro, both effective. Reports withdrawal with Cymbalta taper 2 years ago.   Depression screen PHQ 2/9 08/25/2017  Decreased Interest 0  Down, Depressed, Hopeless 0  PHQ - 2 Score 0  Altered sleeping 0  Tired, decreased energy 0  Change in appetite 0  Feeling bad or failure about yourself  2  Trouble concentrating 0  Moving slowly or fidgety/restless 0  Suicidal thoughts 0  PHQ-9 Score 2  Difficult doing work/chores Somewhat difficult    GAD 7 : Generalized Anxiety Score 08/25/2017  Nervous, Anxious, on Edge 3  Control/stop worrying 3  Worry too much - different things 3  Trouble relaxing 3  Restless 0  Easily annoyed or irritable 0  Afraid - awful might happen 3  Total GAD 7 Score 15  Anxiety Difficulty Somewhat difficult      Past Medical History:  Diagnosis Date  . Allergy   . Anxiety   . Asthma    . Heart murmur    Past Surgical History:  Procedure Laterality Date  . APPENDECTOMY    . INTRAUTERINE DEVICE (IUD) INSERTION     Social History   Tobacco Use  . Smoking status: Former Research scientist (life sciences)  . Smokeless tobacco: Never Used  Substance Use Topics  . Alcohol use: Yes    Alcohol/week: 4.2 oz    Types: 7 Standard drinks or equivalent per week   family history includes Breast cancer in her mother.    ROS: Review of Systems  HENT: Positive for sinus pain ("pressure").   Musculoskeletal: Positive for back pain.  Endo/Heme/Allergies: Positive for environmental allergies.       + weight gain + cold intolerance  Psychiatric/Behavioral: The patient is nervous/anxious.      Medications: Current Outpatient Medications  Medication Sig Dispense Refill  . cetirizine (ZYRTEC) 10 MG tablet Take 10 mg by mouth daily.    . DULoxetine (CYMBALTA) 20 MG capsule Take 1 capsule (20 mg total) by mouth at bedtime for 7 days, THEN 2 capsules (40 mg total) at bedtime for 23 days. 60 capsule 0  . ibuprofen (ADVIL,MOTRIN) 600 MG tablet Take 600 mg by mouth every 6 (six) hours as needed for migraine.    [name]  levonorgestrel (MIRENA, 52 MG,) 20 MCG/24HR IUD by Intrauterine route.    . montelukast (SINGULAIR) 10 MG tablet Take 1 tablet (10 mg total) by mouth at bedtime. 90 tablet 3  . topiramate (TOPAMAX) 25 MG tablet Take 1 tablet (25 mg total) by mouth at bedtime for 7 days, THEN 1 tablet (25 mg total) 2 (two) times daily for 23 days. 60 tablet 0   No current facility-administered medications for this visit.    Allergies  Allergen Reactions  . Penicillins Hives  . Sulfa Antibiotics Hives  . Sulfonamide Derivatives        Objective:  BP 133/88   Pulse 73   Ht 5\' 3"  (1.6 m)   Wt 168 lb (76.2 kg)   BMI 29.76 kg/m  Gen:  alert, not ill-appearing, no distress, appropriate for age 88: head normocephalic without obvious abnormality, conjunctiva and cornea clear, trachea midline Pulm: Normal  work of breathing, normal phonation, clear to auscultation bilaterally, no wheezes, rales or rhonchi CV: Normal rate, regular rhythm, s1 and s2 distinct, grade III/VI systolic murmur Neuro: alert and oriented x 3, no tremor MSK: extremities atraumatic, normal gait and station Skin: intact, no rashes on exposed skin, no jaundice, no cyanosis Psych: well-groomed, cooperative, good eye contact, euthymic mood, appears anxious, affect mood-congruent, speech is articulate, and thought processes clear and goal-directed    No results found for this or any previous visit (from the past 72 hour(s)). No results found.    Assessment and Plan: 52 y.o. female with   Encounter to establish care  Secondary amenorrhea - Plan: FSH/LH  Breast cancer screening, high risk patient - Plan: MM 3D SCREEN BREAST BILATERAL  Family history of breast cancer in mother  White coat syndrome without diagnosis of hypertension  Non-seasonal allergic rhinitis, unspecified trigger - Plan: montelukast (SINGULAIR) 10 MG tablet  Abnormal weight gain - Plan: topiramate (TOPAMAX) 25 MG tablet  Migraine with aura and without status migrainosus, not intractable - Plan: topiramate (TOPAMAX) 25 MG tablet  Screening for thyroid disorder - Plan: TSH + free T4  Screening for lipid disorders - Plan: Lipid Panel w/reflex Direct LDL  Screening for blood disease - Plan: CBC, Comprehensive metabolic panel  Anxiety disorder, unspecified type - Plan: DULoxetine (CYMBALTA) 20 MG capsule  BMI 29.0-29.9,adult  - Personally reviewed PMH, PSH, PFH, medications, allergies, HM - Age-appropriate cancer screening: due for mammogram, order placed; Pap UTD, requesting records from [facility] family medicine, overdue for colon cancer screening - Influenza n/a - Tdap UTD - PHQ2 negative  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 Reports low normal readings at home Starting Cymbalta  self-titration: 20 mg QHS x 7 days, increase to 40 mg QHS Follow-up in 1 month  Overweight BMI Wt Readings from Last 3 Encounters:  08/25/17 168 lb (76.2 kg)  10/30/05 155 lb (70.3 kg)  - BMI 29, multifactorial weight gain due to decreased activity level, medication changes, and possible menopause - checking labs as above - restarting Topiramate 25 mg QHS x 1 week, then 25 mg bid - follow-up in 1 month  Perennial allergic rhinitis - cont Zyrtec 10 mg, adding Singulair 10 mg QHS - discussed intranasal steroid, patient reports issues with compliance. Okay to cont oral decongestant prn and monitor BP  Patient education and anticipatory guidance given Patient agrees with treatment plan Follow-up in 1 month or sooner as needed if symptoms worsen or fail to improve  Darlyne Russian PA-C

## 2017-08-25 NOTE — Patient Instructions (Signed)

## 2017-08-28 ENCOUNTER — Encounter: Payer: Self-pay | Admitting: Osteopathic Medicine

## 2017-08-28 ENCOUNTER — Ambulatory Visit (INDEPENDENT_AMBULATORY_CARE_PROVIDER_SITE_OTHER): Payer: 59 | Admitting: Osteopathic Medicine

## 2017-08-28 VITALS — BP 151/88 | HR 55 | Temp 98.2°F | Wt 167.5 lb

## 2017-08-28 DIAGNOSIS — R350 Frequency of micturition: Secondary | ICD-10-CM | POA: Diagnosis not present

## 2017-08-28 DIAGNOSIS — R030 Elevated blood-pressure reading, without diagnosis of hypertension: Secondary | ICD-10-CM | POA: Diagnosis not present

## 2017-08-28 LAB — POCT URINALYSIS DIPSTICK
BILIRUBIN UA: NEGATIVE
GLUCOSE UA: NEGATIVE
KETONES UA: NEGATIVE
Leukocytes, UA: NEGATIVE
Nitrite, UA: NEGATIVE
Protein, UA: NEGATIVE
SPEC GRAV UA: 1.015 (ref 1.010–1.025)
Urobilinogen, UA: 0.2 E.U./dL
pH, UA: 8 (ref 5.0–8.0)

## 2017-08-28 MED ORDER — NITROFURANTOIN MONOHYD MACRO 100 MG PO CAPS: 100.0000 mg | ORAL_CAPSULE | Freq: Two times a day (BID) | ORAL | 0 refills | Status: DC

## 2017-08-28 NOTE — Patient Instructions (Addendum)
WiIl wait on urine culture results - may take 2-3 days to come back - no news is good news, but I may call to check up on ou depending on results   Antibiotics at pharmacy if needed   Will call you with urine results (microscope test) probably tomorrow   Call the office if there is anything else you need!

## 2017-08-28 NOTE — Progress Notes (Signed)
HPI: Adrienne Lara is a 52 y.o. female who  has a past medical history of Allergy, Anxiety, Asthma, and Heart murmur.  she presents to [facility] today, 08/28/17,  for chief complaint of:  Possible UTI  One day of urinary frequency. No fever/chills. No N/V.  Has been a while since similar symptoms, she tends to get some pelvic/urinary irritation with low back pain which is also flaring up right now.    Past medical history, surgical history, and family history reviewed.  Current medication list and allergy/intolerance information reviewed.   (See remainder of HPI, ROS, Phys Exam below)   Results for orders placed or performed in visit on 08/28/17 (from the past 72 hour(s))  POCT Urinalysis Dipstick     Status: None   Collection Time: 08/28/17 10:55 AM  Result Value Ref Range   Color, UA Yellow    Clarity, UA Clear    Glucose, UA Negative Negative   Bilirubin, UA Negative    Ketones, UA Negative    Spec Grav, UA 1.015 1.010 - 1.025   Blood, UA Trace-lysed    pH, UA 8.0 5.0 - 8.0   Protein, UA Negative Negative   Urobilinogen, UA 0.2 0.2 or 1.0 E.U./dL   Nitrite, UA Negative    Leukocytes, UA Negative Negative   Appearance     Odor       ASSESSMENT/PLAN:   Urine frequency - Plan: POCT Urinalysis Dipstick, Urinalysis, microscopic only, Urine Culture  White coat syndrome without diagnosis of hypertension   Nothing too concerning on point-of-care urinalysis.  Some trace blood, patient states she has long-standing microscopic hematuria.  Will defer to PCP.  No dysuria, await urine microscopy, patient advised on filling antibiotics if dysuria develops or frequency persists or based on lab results   Meds ordered this encounter  Medications  . nitrofurantoin, macrocrystal-monohydrate, (MACROBID) 100 MG capsule    Sig: Take 1 capsule (100 mg total) by mouth 2 (two) times daily.    Dispense:  10 capsule    Refill:  0    Patient  Instructions  WiIl wait on urine culture results - may take 2-3 days to come back - no news is good news, but I may call to check up on ou depending on results   Antibiotics at pharmacy if needed   Will call you with urine results (microscope test) probably tomorrow   Call the office if there is anything else you need!    Follow-up plan: Return for recheck as needed! .     ############################################ ############################################ ############################################ ############################################    Outpatient Encounter Medications as of 08/28/2017  Medication Sig  . cetirizine (ZYRTEC) 10 MG tablet Take 10 mg by mouth daily.  . DULoxetine (CYMBALTA) 20 MG capsule TAKE 1 CAPSULE(20 MG) BY MOUTH AT BEDTIME FOR 7 DAYS THEN TAKE 2 CAPSULES(40 MG) BY MOUTH AT BEDTIME FOR 23 DAYS  . ibuprofen (ADVIL,MOTRIN) 600 MG tablet Take 600 mg by mouth every 6 (six) hours as needed for migraine.  [name] levonorgestrel (MIRENA, 52 MG,) 20 MCG/24HR IUD by Intrauterine route.  . montelukast (SINGULAIR) 10 MG tablet Take 1 tablet (10 mg total) by mouth at bedtime.  . topiramate (TOPAMAX) 25 MG tablet TAKE 1 TABLET(25 MG) BY MOUTH AT BEDTIME FOR 7 DAYS THEN TAKE 1 TABLET(25 MG) BY MOUTH TWICE DAILY FOR 23 DAYS   No facility-administered encounter medications on file as of 08/28/2017.    Allergies  Allergen Reactions  . Penicillins  Hives  . Sulfa Antibiotics Hives  . Sulfonamide Derivatives       Review of Systems:  Constitutional: No fever  HEENT: No  headache, no vision change  Cardiac: No  chest pain, No  pressure, No palpitations  Respiratory:  No  shortness of breath. No  Cough  Gastrointestinal: No  abdominal pain  Musculoskeletal: +new myalgia/arthralgia -lower back pain  Skin: No  Rash  Neurologic: No  weakness, No  Dizziness   Exam:  BP (!) 154/83 (BP Location: Left Arm, Patient Position: Sitting, Cuff Size: Normal)   Pulse  61   Temp 98.2 F (36.8 C) (Oral)   Wt 167 lb 8 oz (76 kg)   BMI 29.67 kg/m   Constitutional: VS see above. General Appearance: alert, well-developed, well-nourished, NAD  Eyes: Normal lids and conjunctive, non-icteric sclera  Ears, Nose, Mouth, Throat: MMM, Normal external inspection ears/nares/mouth/lips/gums.  Neck: No masses, trachea midline.   Respiratory: Normal respiratory effort.   Musculoskeletal: Gait normal.   Neurological: Normal balance/coordination. No tremor.  Skin: warm, dry, intact.   Psychiatric: Normal judgment/insight. Normal mood and affect. Oriented x3.   Visit summary with medication list and pertinent instructions was printed for patient to review, advised to alert [place] if any changes needed. All questions at time of visit were answered - patient instructed to contact office with any additional concerns. ER/RTC precautions were reviewed with the patient and understanding verbalized.   Follow-up plan: Return for recheck as needed! .    Please note: voice recognition software was used to produce this document, and typos may escape review. Please contact Dr. Sheppard Coil for any needed clarifications.

## 2017-08-29 LAB — URINE CULTURE
MICRO NUMBER:: 90941364
SPECIMEN QUALITY:: ADEQUATE

## 2017-08-29 LAB — URINALYSIS, MICROSCOPIC ONLY
Bacteria, UA: NONE SEEN /HPF
Hyaline Cast: NONE SEEN /LPF
RBC / HPF: NONE SEEN /HPF (ref 0–2)
Squamous Epithelial / LPF: NONE SEEN /HPF (ref <=5)
WBC UA: NONE SEEN /HPF (ref 0–5)

## 2017-09-11 ENCOUNTER — Ambulatory Visit (INDEPENDENT_AMBULATORY_CARE_PROVIDER_SITE_OTHER): Payer: 59 | Admitting: Osteopathic Medicine

## 2017-09-11 VITALS — BP 131/67 | HR 60 | Wt 165.0 lb

## 2017-09-11 DIAGNOSIS — R030 Elevated blood-pressure reading, without diagnosis of hypertension: Secondary | ICD-10-CM

## 2017-09-11 NOTE — Progress Notes (Signed)
BP 131/67   Pulse 60   Wt 165 lb (74.8 kg)   SpO2 99%   BMI 29.23 kg/m   BP looks ok, continue f/u with PCP as directed

## 2017-09-11 NOTE — Progress Notes (Signed)
   Subjective:    Patient ID: Adrienne Lara, female    DOB: 12-08-1965, 52 y.o.   MRN: [id number]  HPI  Adrienne Lara is here for follow up on elevated blood pressure. Denies chest pain, shortness of breath, headaches or dizziness.   Review of Systems     Objective:   Physical Exam        Assessment & Plan:  Blood pressure within normal limits. Follow up as needed.

## 2017-09-12 LAB — FSH/LH
FSH: 6.8 m[IU]/mL
LH: 4.1 m[IU]/mL

## 2017-09-12 LAB — COMPREHENSIVE METABOLIC PANEL
AG Ratio: 1.7 (calc) (ref 1.0–2.5)
ALT: 13 U/L (ref 6–29)
AST: 17 U/L (ref 10–35)
Albumin: 4.3 g/dL (ref 3.6–5.1)
Alkaline phosphatase (APISO): 45 U/L (ref 33–130)
BILIRUBIN TOTAL: 0.8 mg/dL (ref 0.2–1.2)
BUN: 11 mg/dL (ref 7–25)
CALCIUM: 9.4 mg/dL (ref 8.6–10.4)
CO2: 23 mmol/L (ref 20–32)
Chloride: 104 mmol/L (ref 98–110)
Creat: 1.01 mg/dL (ref 0.50–1.05)
GLUCOSE: 100 mg/dL — AB (ref 65–99)
Globulin: 2.6 g/dL (calc) (ref 1.9–3.7)
Potassium: 4 mmol/L (ref 3.5–5.3)
SODIUM: 136 mmol/L (ref 135–146)
TOTAL PROTEIN: 6.9 g/dL (ref 6.1–8.1)

## 2017-09-12 LAB — CBC
HCT: 41.5 % (ref 35.0–45.0)
Hemoglobin: 14.1 g/dL (ref 11.7–15.5)
MCH: 30 pg (ref 27.0–33.0)
MCHC: 34 g/dL (ref 32.0–36.0)
MCV: 88.3 fL (ref 80.0–100.0)
MPV: 10.2 fL (ref 7.5–12.5)
PLATELETS: 266 10*3/uL (ref 140–400)
RBC: 4.7 10*6/uL (ref 3.80–5.10)
RDW: 12.7 % (ref 11.0–15.0)
WBC: 4.8 10*3/uL (ref 3.8–10.8)

## 2017-09-12 LAB — LIPID PANEL W/REFLEX DIRECT LDL
CHOL/HDL RATIO: 3.4 (calc) (ref <5.0)
CHOLESTEROL: 211 mg/dL — AB (ref <200)
HDL: 62 mg/dL (ref >50)
LDL CHOLESTEROL (CALC): 133 mg/dL — AB
Non-HDL Cholesterol (Calc): 149 mg/dL (calc) — ABNORMAL HIGH (ref <130)
Triglycerides: 65 mg/dL (ref <150)

## 2017-09-12 LAB — TSH+FREE T4: TSH W/REFLEX TO FT4: 1.46 m[IU]/L

## 2017-09-12 NOTE — Progress Notes (Signed)
No significant lab abnormalities No evidence of menopause Total cholesterol is a little borderline. Increase aerobic exercise and follow heart healthy Mediterranean diet

## 2017-09-17 ENCOUNTER — Ambulatory Visit (INDEPENDENT_AMBULATORY_CARE_PROVIDER_SITE_OTHER): Payer: 59

## 2017-09-17 DIAGNOSIS — R928 Other abnormal and inconclusive findings on diagnostic imaging of breast: Secondary | ICD-10-CM

## 2017-09-17 DIAGNOSIS — Z1239 Encounter for other screening for malignant neoplasm of breast: Secondary | ICD-10-CM

## 2017-09-19 ENCOUNTER — Other Ambulatory Visit: Payer: Self-pay | Admitting: Physician Assistant

## 2017-09-19 DIAGNOSIS — R928 Other abnormal and inconclusive findings on diagnostic imaging of breast: Secondary | ICD-10-CM

## 2017-09-23 ENCOUNTER — Ambulatory Visit (INDEPENDENT_AMBULATORY_CARE_PROVIDER_SITE_OTHER): Payer: 59 | Admitting: Physician Assistant

## 2017-09-23 ENCOUNTER — Encounter: Payer: Self-pay | Admitting: Physician Assistant

## 2017-09-23 VITALS — BP 154/92 | HR 76 | Ht 63.0 in | Wt 167.0 lb

## 2017-09-23 DIAGNOSIS — F419 Anxiety disorder, unspecified: Secondary | ICD-10-CM | POA: Diagnosis not present

## 2017-09-23 DIAGNOSIS — Z6829 Body mass index (BMI) 29.0-29.9, adult: Secondary | ICD-10-CM | POA: Diagnosis not present

## 2017-09-23 DIAGNOSIS — G43109 Migraine with aura, not intractable, without status migrainosus: Secondary | ICD-10-CM | POA: Diagnosis not present

## 2017-09-23 DIAGNOSIS — R030 Elevated blood-pressure reading, without diagnosis of hypertension: Secondary | ICD-10-CM

## 2017-09-23 DIAGNOSIS — R635 Abnormal weight gain: Secondary | ICD-10-CM | POA: Diagnosis not present

## 2017-09-23 DIAGNOSIS — E663 Overweight: Secondary | ICD-10-CM | POA: Insufficient documentation

## 2017-09-23 MED ORDER — DULOXETINE HCL 20 MG PO CPEP: 20.0000 mg | ORAL_CAPSULE | Freq: Two times a day (BID) | ORAL | 0 refills | Status: DC

## 2017-09-23 MED ORDER — TOPIRAMATE 50 MG PO TABS: 50.0000 mg | ORAL_TABLET | Freq: Two times a day (BID) | ORAL | 0 refills | Status: DC

## 2017-09-23 NOTE — Progress Notes (Signed)
HPI:                                                                Adrienne Lara is a 52 y.o. female who presents to [facility] [facility] today for anxiety follow-up  She was re-started on Cymbalta 1 month ago and reports significant improvement in her anxiety symptoms and how she copes with daily stressors. Denies depressed mood/anhedonia.  Denies symptoms of mania/hypomania. Denies suicidal thinking. Denies auditory/visual hallucinations.  BP was elevated at last office visit, 151/88. She is monitoring her BP at home and home readings are 120's/60's-70's consistently. Denies vision change, headache, chest pain with exertion, orthopnea, lightheadedness, syncope and edema. Risk factors include: none   Has not noted any weight loss since starting Topamax 25 mg twice a day, 1 month ago. Currently using MyFitnessPal and trying to adhere to 1200 calories per day. Healthiest adult weight 135-140 in 2015. This is her goal.     Depression screen [facility] 2/9 09/23/2017 08/25/2017  Decreased Interest 0 0  Down, Depressed, Hopeless 0 0  PHQ - 2 Score 0 0  Altered sleeping 1 0  Tired, decreased energy 0 0  Change in appetite 0 0  Feeling bad or failure about yourself  1 2  Trouble concentrating 0 0  Moving slowly or fidgety/restless 0 0  Suicidal thoughts 0 0  PHQ-9 Score 2 2  Difficult doing work/chores Not difficult at all Somewhat difficult    GAD 7 : Generalized Anxiety Score 09/23/2017 08/25/2017  Nervous, Anxious, on Edge 1 3  Control/stop worrying 1 3  Worry too much - different things 0 3  Trouble relaxing 0 3  Restless 0 0  Easily annoyed or irritable 0 0  Afraid - awful might happen 0 3  Total GAD 7 Score 2 15  Anxiety Difficulty Not difficult at all Somewhat difficult      Past Medical History:  Diagnosis Date  . Allergy   . Anxiety   . Asthma   . Heart murmur    Past Surgical History:  Procedure Laterality Date  .  APPENDECTOMY    . BREAST BIOPSY Right   . INTRAUTERINE DEVICE (IUD) INSERTION    . REDUCTION MAMMAPLASTY     Social History   Tobacco Use  . Smoking status: Former Research scientist (life sciences)  . Smokeless tobacco: Never Used  Substance Use Topics  . Alcohol use: Yes    Alcohol/week: 7.0 standard drinks    Types: 7 Standard drinks or equivalent per week   family history includes Breast cancer in her mother.    ROS: negative except as noted in the HPI  Medications: Current Outpatient Medications  Medication Sig Dispense Refill  . cetirizine (ZYRTEC) 10 MG tablet Take 10 mg by mouth daily.    . DULoxetine (CYMBALTA) 20 MG capsule Take 1 capsule (20 mg total) by mouth 2 (two) times daily. 180 capsule 0  . ibuprofen (ADVIL,MOTRIN) 600 MG tablet Take 600 mg by mouth every 6 (six) hours as needed for migraine.    [name] levonorgestrel (MIRENA, 52 MG,) 20 MCG/24HR IUD by Intrauterine route.    . topiramate (TOPAMAX) 50 MG tablet Take 1 tablet (50 mg total) by mouth 2 (two) times daily. 180 tablet 0  No current facility-administered medications for this visit.    Allergies  Allergen Reactions  . Penicillins Hives  . Sulfa Antibiotics Hives  . Sulfonamide Derivatives        Objective:  BP (!) 154/92   Pulse 76   Ht 5\' 3"  (1.6 m)   Wt 167 lb (75.8 kg)   BMI 29.58 kg/m  Gen:  alert, not ill-appearing, no distress, appropriate for age 50: head normocephalic without obvious abnormality, conjunctiva and cornea clear, trachea midline Pulm: Normal work of breathing, normal phonation, clear to auscultation bilaterally, no wheezes, rales or rhonchi CV: Normal rate, regular rhythm, s1 and s2 distinct, no murmurs, clicks or rubs  Neuro: alert and oriented x 3, no tremor MSK: extremities atraumatic, normal gait and station Skin: intact, no rashes on exposed skin, no jaundice, no cyanosis Psych: well-groomed, cooperative, good eye contact, euthymic mood, affect mood-congruent, speech is articulate, and  thought processes clear and goal-directed  Wt Readings from Last 3 Encounters:  09/23/17 167 lb (75.8 kg)  09/11/17 165 lb (74.8 kg)  08/28/17 167 lb 8 oz (76 kg)     No results found for this or any previous visit (from the past 72 hour(s)). No results found.    Assessment and Plan: 53 y.o. female with   .Diagnoses and all orders for this visit:  Anxiety disorder, unspecified type -     DULoxetine (CYMBALTA) 20 MG capsule; Take 1 capsule (20 mg total) by mouth 2 (two) times daily.  Abnormal weight gain -     topiramate (TOPAMAX) 50 MG tablet; Take 1 tablet (50 mg total) by mouth 2 (two) times daily.  Migraine with aura and without status migrainosus, not intractable -     topiramate (TOPAMAX) 50 MG tablet; Take 1 tablet (50 mg total) by mouth 2 (two) times daily.  BMI 29.0-29.9,adult  White coat syndrome without diagnosis of hypertension    GAD7=2 today, great response to Cymbalta. Plan to continue 20 mg bid   She has maintained weight at 167 lb and would like to increase her Topamax. Increasing to 50 mg bid. Encouraged to continue logging foods/drink with MyFitnesspal and adhering to 1200-1300 calorie/day heart helathy diet  BP out of range in office today. Home BP readings are in range. This is likely white coat syndrome without true underlying hypertension. No additional CVD risk factors. Encouraged to continue monitoring at home.  Patient education and anticipatory guidance given Patient agrees with treatment plan Follow-up as needed if symptoms worsen or fail to improve  Darlyne Russian PA-C

## 2017-09-23 NOTE — Patient Instructions (Addendum)
Use MyFitnessPal app to log daily intake of food, drink and exercise.  Make snacks high in protein (>10g) and low in carbs (<15g). Stay away from high sugar drinks and foods.  Consider getting a Ecologist or Garmin to track daily steps.  Aim for 10,000 steps per day.  Stay active! Try to work out 3-4 days per week for 30-45 minutes. Aim for 64 oz of water each day. Work on stress reduction, meal planning and 8 hours of sleep at night. Don't skip meals. Can substitute a protein drink for one meal per day

## 2017-09-24 ENCOUNTER — Ambulatory Visit
Admission: RE | Admit: 2017-09-24 | Discharge: 2017-09-24 | Disposition: A | Discharge status: Home / Self Care | Payer: 59 | Source: Ambulatory Visit | Attending: Physician Assistant | Admitting: Physician Assistant

## 2017-09-24 ENCOUNTER — Other Ambulatory Visit: Payer: Self-pay | Admitting: Physician Assistant

## 2017-09-24 DIAGNOSIS — N6489 Other specified disorders of breast: Secondary | ICD-10-CM

## 2017-09-24 DIAGNOSIS — R928 Other abnormal and inconclusive findings on diagnostic imaging of breast: Secondary | ICD-10-CM

## 2017-09-26 ENCOUNTER — Other Ambulatory Visit: Payer: Self-pay | Admitting: Physician Assistant

## 2017-09-26 ENCOUNTER — Ambulatory Visit
Admission: RE | Admit: 2017-09-26 | Discharge: 2017-09-26 | Disposition: A | Discharge status: Home / Self Care | Payer: 59 | Source: Ambulatory Visit | Attending: Physician Assistant | Admitting: Physician Assistant

## 2017-09-26 DIAGNOSIS — N6489 Other specified disorders of breast: Secondary | ICD-10-CM

## 2017-10-03 ENCOUNTER — Ambulatory Visit: Payer: 59 | Admitting: Physician Assistant

## 2017-10-03 ENCOUNTER — Encounter: Payer: Self-pay | Admitting: Physician Assistant

## 2017-10-03 VITALS — BP 123/77 | HR 69 | Temp 98.3°F | Wt 169.0 lb

## 2017-10-03 DIAGNOSIS — J069 Acute upper respiratory infection, unspecified: Secondary | ICD-10-CM

## 2017-10-03 MED ORDER — HYDROCOD POLST-CPM POLST ER 10-8 MG/5ML PO SUER: 5.0000 mL | Freq: Two times a day (BID) | ORAL | 0 refills | Status: DC | PRN

## 2017-10-03 MED ORDER — IPRATROPIUM BROMIDE 0.06 % NA SOLN: 2.0000 | Freq: Four times a day (QID) | NASAL | 0 refills | Status: DC | PRN

## 2017-10-03 NOTE — Patient Instructions (Addendum)
For nasal symptoms/sinusitis: - OTC Afrin nasal spray (warning: do not use for more than 3 days, leads to rebound congestion) - nasal saline rinses / netti pot (do this prior to nasal spray) - warm facial compresses - oral decongestants and antihistamines like Claritin-D and Zyrtec-D may help dry up secretions (caution using decongestants if you have high blood pressure, heart disease or kidney disease) - for sinus headache: Tylenol 1000mg  every 8 hours as needed for throat pain. Alternate with Ibuprofen 600mg  every 6 hours  For sore throat: - Tylenol 1000mg  every 8 hours as needed for throat pain. Alternate with Ibuprofen 600mg  every 6 hours - Cepacol throat lozenges and/or Chloraseptic spray - Warm salt water gargles  For cough: - Mucinex with at least 8 oz. of water to make cough more productive Okay to use cough suppressant at bedtime for rest  Note: follow package instructions for all over-the-counter medications. If using multi-symptom medications (Dayquil, Theraflu, etc.), check the label for duplicate drug ingredients.

## 2017-10-03 NOTE — Progress Notes (Signed)
HPI:                                                                Adrienne Lara is a 52 y.o. female who presents to [place]: [place] today for URI symptoms  URI   This is a new problem. The current episode started in the past 7 days (3-4 days). The problem has been unchanged. There has been no fever. Associated symptoms include congestion and coughing. Pertinent negatives include no ear pain, nausea, sore throat or vomiting. Treatments tried: OTC cough/cold med. The treatment provided no relief.     Past Medical History:  Diagnosis Date  . Allergy   . Anxiety   . Asthma   . Heart murmur    Past Surgical History:  Procedure Laterality Date  . APPENDECTOMY    . BREAST BIOPSY Right   . INTRAUTERINE DEVICE (IUD) INSERTION    . REDUCTION MAMMAPLASTY     Social History   Tobacco Use  . Smoking status: Former Research scientist (life sciences)  . Smokeless tobacco: Never Used  Substance Use Topics  . Alcohol use: Yes    Alcohol/week: 7.0 standard drinks    Types: 7 Standard drinks or equivalent per week   family history includes Breast cancer (age of onset: 43) in her mother.    ROS: negative except as noted in the HPI  Medications: Current Outpatient Medications  Medication Sig Dispense Refill  . cetirizine (ZYRTEC) 10 MG tablet Take 10 mg by mouth daily.    . chlorpheniramine-HYDROcodone (TUSSIONEX) 10-8 MG/5ML SUER Take 5 mLs by mouth every 12 (twelve) hours as needed for up to 5 days for cough. 50 mL 0  . DULoxetine (CYMBALTA) 20 MG capsule Take 1 capsule (20 mg total) by mouth 2 (two) times daily. 180 capsule 0  . ibuprofen (ADVIL,MOTRIN) 600 MG tablet Take 600 mg by mouth every 6 (six) hours as needed for migraine.    [name] ipratropium (ATROVENT) 0.06 % nasal spray Place 2 sprays into both nostrils 4 (four) times daily as needed for rhinitis. 15 mL 0  . levonorgestrel (MIRENA, 52 MG,) 20 MCG/24HR IUD by Intrauterine route.    . topiramate  (TOPAMAX) 50 MG tablet Take 1 tablet (50 mg total) by mouth 2 (two) times daily. 180 tablet 0   No current facility-administered medications for this visit.    Allergies  Allergen Reactions  . Penicillins Hives  . Sulfa Antibiotics Hives  . Sulfonamide Derivatives        Objective:  BP 123/77   Pulse 69   Temp 98.3 F (36.8 C) (Oral)   Wt 169 lb (76.7 kg)   SpO2 98%   BMI 29.94 kg/m  Gen:  alert, not ill-appearing, no distress, appropriate for age 48: head normocephalic without obvious abnormality, conjunctiva and cornea clear, left TM with air-fluid level, right TM pearly gray and semi-transparent, oropharynx clear, moist mucous membranes, neck supple, no cervical adenopathy, trachea midline Pulm: Normal work of breathing, normal phonation, clear to auscultation bilaterally, no wheezes, rales or rhonchi CV: Normal rate, regular rhythm, s1 and s2 distinct, no murmurs, clicks or rubs  Neuro: alert and oriented x 3, no tremor MSK: extremities atraumatic, normal gait and station Skin: intact, no rashes on exposed  skin, no jaundice, no cyanosis   No results found for this or any previous visit (from the past 72 hour(s)). No results found.    Assessment and Plan: 52 y.o. female with   .Adrienne Lara was seen today for cough.  Diagnoses and all orders for this visit:  Acute upper respiratory infection -     ipratropium (ATROVENT) 0.06 % nasal spray; Place 2 sprays into both nostrils 4 (four) times daily as needed for rhinitis. -     chlorpheniramine-HYDROcodone ([place]) 10-8 MG/5ML SUER; Take 5 mLs by mouth every 12 (twelve) hours as needed for up to 5 days for cough.  - afebrile,no tachypnea, no tachycardia, clear lung sounds - supportive care  Patient education and anticipatory guidance given Patient agrees with treatment plan Follow-up as needed if symptoms worsen or fail to improve  Darlyne Russian PA-C

## 2017-11-19 ENCOUNTER — Ambulatory Visit: Payer: 59 | Admitting: Physician Assistant

## 2017-11-19 ENCOUNTER — Telehealth: Payer: Self-pay | Admitting: Physician Assistant

## 2017-11-19 NOTE — Telephone Encounter (Signed)
FYI pt called at 8:09 to cancel appt. Thanks

## 2017-12-19 ENCOUNTER — Other Ambulatory Visit: Payer: Self-pay | Admitting: Physician Assistant

## 2017-12-19 DIAGNOSIS — G43109 Migraine with aura, not intractable, without status migrainosus: Secondary | ICD-10-CM

## 2017-12-19 DIAGNOSIS — R635 Abnormal weight gain: Secondary | ICD-10-CM

## 2017-12-19 DIAGNOSIS — F419 Anxiety disorder, unspecified: Secondary | ICD-10-CM

## 2017-12-25 ENCOUNTER — Other Ambulatory Visit: Payer: Self-pay | Admitting: Physician Assistant

## 2017-12-25 DIAGNOSIS — G43109 Migraine with aura, not intractable, without status migrainosus: Secondary | ICD-10-CM

## 2017-12-25 DIAGNOSIS — F419 Anxiety disorder, unspecified: Secondary | ICD-10-CM

## 2017-12-25 DIAGNOSIS — R635 Abnormal weight gain: Secondary | ICD-10-CM

## 2017-12-26 ENCOUNTER — Other Ambulatory Visit: Payer: Self-pay | Admitting: Physician Assistant

## 2017-12-26 ENCOUNTER — Other Ambulatory Visit: Payer: Self-pay

## 2017-12-26 DIAGNOSIS — G43109 Migraine with aura, not intractable, without status migrainosus: Secondary | ICD-10-CM

## 2017-12-26 DIAGNOSIS — R635 Abnormal weight gain: Secondary | ICD-10-CM

## 2017-12-26 DIAGNOSIS — F419 Anxiety disorder, unspecified: Secondary | ICD-10-CM

## 2017-12-26 MED ORDER — DULOXETINE HCL 20 MG PO CPEP: 20.0000 mg | ORAL_CAPSULE | Freq: Two times a day (BID) | ORAL | 0 refills | Status: DC

## 2017-12-26 MED ORDER — TOPIRAMATE 50 MG PO TABS: 50.0000 mg | ORAL_TABLET | Freq: Two times a day (BID) | ORAL | 0 refills | Status: DC

## 2018-01-22 ENCOUNTER — Encounter: Payer: Self-pay | Admitting: Physician Assistant

## 2018-01-22 ENCOUNTER — Ambulatory Visit: Payer: 59 | Admitting: Osteopathic Medicine

## 2018-01-22 ENCOUNTER — Ambulatory Visit (INDEPENDENT_AMBULATORY_CARE_PROVIDER_SITE_OTHER): Payer: 59 | Admitting: Physician Assistant

## 2018-01-22 VITALS — BP 145/80 | HR 84 | Wt 164.0 lb

## 2018-01-22 DIAGNOSIS — Z1211 Encounter for screening for malignant neoplasm of colon: Secondary | ICD-10-CM

## 2018-01-22 DIAGNOSIS — R635 Abnormal weight gain: Secondary | ICD-10-CM | POA: Diagnosis not present

## 2018-01-22 DIAGNOSIS — R030 Elevated blood-pressure reading, without diagnosis of hypertension: Secondary | ICD-10-CM

## 2018-01-22 DIAGNOSIS — G43109 Migraine with aura, not intractable, without status migrainosus: Secondary | ICD-10-CM

## 2018-01-22 DIAGNOSIS — F419 Anxiety disorder, unspecified: Secondary | ICD-10-CM

## 2018-01-22 DIAGNOSIS — Z79899 Other long term (current) drug therapy: Secondary | ICD-10-CM

## 2018-01-22 DIAGNOSIS — J3089 Other allergic rhinitis: Secondary | ICD-10-CM

## 2018-01-22 DIAGNOSIS — E663 Overweight: Secondary | ICD-10-CM | POA: Diagnosis not present

## 2018-01-22 DIAGNOSIS — J4520 Mild intermittent asthma, uncomplicated: Secondary | ICD-10-CM

## 2018-01-22 MED ORDER — TOPIRAMATE 50 MG PO TABS: 50.0000 mg | ORAL_TABLET | Freq: Two times a day (BID) | ORAL | 1 refills | Status: DC

## 2018-01-22 MED ORDER — LEVOCETIRIZINE DIHYDROCHLORIDE 5 MG PO TABS: 5.0000 mg | ORAL_TABLET | Freq: Every evening | ORAL | 3 refills | Status: DC

## 2018-01-22 MED ORDER — DULOXETINE HCL 20 MG PO CPEP: 20.0000 mg | ORAL_CAPSULE | Freq: Two times a day (BID) | ORAL | 1 refills | Status: DC

## 2018-01-22 MED ORDER — ALBUTEROL SULFATE HFA 108 (90 BASE) MCG/ACT IN AERS: 1.0000 | INHALATION_SPRAY | RESPIRATORY_TRACT | 2 refills | Status: DC | PRN

## 2018-01-22 MED ORDER — MONTELUKAST SODIUM 5 MG PO CHEW: 5.0000 mg | CHEWABLE_TABLET | Freq: Every day | ORAL | 3 refills | Status: DC

## 2018-01-22 MED ORDER — IPRATROPIUM BROMIDE 0.06 % NA SOLN: 2.0000 | Freq: Four times a day (QID) | NASAL | 0 refills | Status: DC | PRN

## 2018-01-22 NOTE — Progress Notes (Signed)
HPI:                                                                Adrienne Lara is a 53 y.o. female who presents to [place]: [place] today for weight gain  Concerned about weight gain today. Current weight 164 lb Reports healthy adult weight is 129 lb. Last weighed this in 2017 States she has lost about 5 pounds in the last 2 weeks. Exercise: 3 days strength and 1-2 days of cardio Diet: increased her dietary protein  Using FitBit tracker  Migraines: "I cant remember the last time I had one."  Well controlled on Topamax 50 mg twice daily.  Anxiety: Taking Cymbalta 20 mg twice a day.  States "I feel great."  Denies depressed mood or anhedonia.  Denies excessive worry or irritability, sleep disturbance, restlessness.  Environmental allergies: Reports she is still struggling with postnasal drip and itchy watery eyes.  She is requesting a refill of albuterol for allergy induced asthma.  Denies any asthma exacerbations in the last year.  She takes Zyrtec-D as needed, but this has not been working well for her.  White coat syndrome: Reports she continues to monitor her blood pressures at home intermittently.  Blood pressures are typically 120s over 70s.   Denies vision change, headache, chest pain with exertion, orthopnea, lightheadedness, syncope and edema. Risk factors include:    Past Medical History:  Diagnosis Date  . Allergy   . Anxiety   . Asthma   . Heart murmur    Past Surgical History:  Procedure Laterality Date  . APPENDECTOMY    . BREAST BIOPSY Right   . INTRAUTERINE DEVICE (IUD) INSERTION    . REDUCTION MAMMAPLASTY     Social History   Tobacco Use  . Smoking status: Former Research scientist (life sciences)  . Smokeless tobacco: Never Used  Substance Use Topics  . Alcohol use: Yes    Alcohol/week: 7.0 standard drinks    Types: 7 Standard drinks or equivalent per week   family history includes Alcohol abuse in her brother and sister;  Breast cancer (age of onset: 73) in her mother; Heart attack in her sister; Obesity in her mother and sister.    ROS: negative except as noted in the HPI  Medications: Current Outpatient Medications  Medication Sig Dispense Refill  . albuterol (PROVENTIL HFA;VENTOLIN HFA) 108 (90 Base) MCG/ACT inhaler Inhale 1-2 puffs into the lungs every 4 (four) hours as needed for wheezing or shortness of breath (bronchospasm). 1 Inhaler 2  . DULoxetine (CYMBALTA) 20 MG capsule Take 1 capsule (20 mg total) by mouth 2 (two) times daily. Due for follow up visit w/PCP 180 capsule 1  . ibuprofen (ADVIL,MOTRIN) 600 MG tablet Take 600 mg by mouth every 6 (six) hours as needed for migraine.    [name] ipratropium (ATROVENT) 0.06 % nasal spray Place 2 sprays into both nostrils 4 (four) times daily as needed for rhinitis. 15 mL 0  . levocetirizine (XYZAL ALLERGY 24HR) 5 MG tablet Take 1 tablet (5 mg total) by mouth every evening. 90 tablet 3  . levonorgestrel (MIRENA, 52 MG,) 20 MCG/24HR IUD by Intrauterine route.    . montelukast (SINGULAIR) 5 MG chewable tablet Chew 1 tablet (5 mg total) by  mouth at bedtime. 90 tablet 3  . topiramate (TOPAMAX) 50 MG tablet Take 1 tablet (50 mg total) by mouth 2 (two) times daily. Due for follow up visit w/PCP 180 tablet 1   No current facility-administered medications for this visit.    Allergies  Allergen Reactions  . Penicillins Hives  . Sulfa Antibiotics Hives  . Sulfonamide Derivatives        Objective:  BP (!) 145/80   Pulse 84   Wt 164 lb (74.4 kg)   BMI 29.05 kg/m  Gen:  alert, not ill-appearing, no distress, appropriate for age 30: head normocephalic without obvious abnormality, conjunctiva and cornea clear, trachea midline Pulm: Normal work of breathing, normal phonation, clear to auscultation bilaterally, no wheezes, rales or rhonchi CV: Normal rate, regular rhythm, s1 and s2 distinct, no murmurs, clicks or rubs  Neuro: alert and oriented x 3, no  tremor MSK: extremities atraumatic, normal gait and station Skin: intact, no rashes on exposed skin, no jaundice, no cyanosis Psych: well-groomed, cooperative, good eye contact, euthymic mood, affect mood-congruent, speech is articulate, and thought processes clear and goal-directed   Wt Readings from Last 3 Encounters:  01/22/18 164 lb (74.4 kg)  10/03/17 169 lb (76.7 kg)  09/23/17 167 lb (75.8 kg)   Lab Results  Component Value Date   CREATININE 1.01 09/11/2017   BUN 11 09/11/2017   NA 136 09/11/2017   K 4.0 09/11/2017   CL 104 09/11/2017   CO2 23 09/11/2017   Lab Results  Component Value Date   ALT 13 09/11/2017   AST 17 09/11/2017   BILITOT 0.8 09/11/2017   Lab Results  Component Value Date   TSH 0.746 10/23/2006     Assessment and Plan: 53 y.o. female with   .Adrienne Lara was seen today for weight gain.  Diagnoses and all orders for this visit:  Encounter for long-term (current) use of medications  Abnormal weight gain -     topiramate (TOPAMAX) 50 MG tablet; Take 1 tablet (50 mg total) by mouth 2 (two) times daily. Due for follow up visit w/PCP  Overweight with body mass index (BMI) 25.0-29.9  Migraine with aura and without status migrainosus, not intractable -     topiramate (TOPAMAX) 50 MG tablet; Take 1 tablet (50 mg total) by mouth 2 (two) times daily. Due for follow up visit w/PCP  Anxiety disorder, unspecified type -     DULoxetine (CYMBALTA) 20 MG capsule; Take 1 capsule (20 mg total) by mouth 2 (two) times daily. Due for follow up visit w/PCP  Perennial allergic rhinitis -     montelukast (SINGULAIR) 5 MG chewable tablet; Chew 1 tablet (5 mg total) by mouth at bedtime. -     ipratropium (ATROVENT) 0.06 % nasal spray; Place 2 sprays into both nostrils 4 (four) times daily as needed for rhinitis. -     levocetirizine (XYZAL ALLERGY 24HR) 5 MG tablet; Take 1 tablet (5 mg total) by mouth every evening.  Mild intermittent reactive airway disease without  complication -     albuterol (PROVENTIL HFA;VENTOLIN HFA) 108 (90 Base) MCG/ACT inhaler; Inhale 1-2 puffs into the lungs every 4 (four) hours as needed for wheezing or shortness of breath (bronchospasm). -     montelukast (SINGULAIR) 5 MG chewable tablet; Chew 1 tablet (5 mg total) by mouth at bedtime.  Colon cancer screening Comments: Cologuard ordered 01/22/18 Orders: -     Cologuard  White coat syndrome without diagnosis of hypertension   Weight gain,  BMI 29: We discussed that a weight loss of 1/2 to 1 pound per week is very reasonable and sustainable.  She is actually exceeding this goal and doing so in a healthy manner with dietary changes and increased physical activity.  Encouraged her to continue this regimen.  We discussed the option to increase Topamax for appetite suppression, but she declined due to concern for worsening cognitive function.  She was also provided with a list of medications for weight loss.  I did explain to her that since her BMI is below 30 insurance would not cover these medications, but she would have the option to use cash or we could prescribe something generic off label.  Allergic rhinitis: Switching from Zyrtec-D to Xyzal.  She had some hypersomnolence with Singulair, so I will reduce her to the pediatric dose of 5 mg nightly.  Encouraged her to use Atrovent nasal spray for postnasal drip.  She declined Pataday eyedrops today.  Migraines: Well-controlled on Topamax 50 mg twice daily.  Continue current medications  Anxiety disorder: Well-controlled on duloxetine 20 mg twice daily, continue current medications.    Patient education and anticipatory guidance given Patient agrees with treatment plan Follow-up in 6 months for medication management or sooner as needed if symptoms worsen or fail to improve  Darlyne Russian PA-C

## 2018-01-23 DIAGNOSIS — Z79899 Other long term (current) drug therapy: Secondary | ICD-10-CM | POA: Insufficient documentation

## 2018-01-23 DIAGNOSIS — Z1211 Encounter for screening for malignant neoplasm of colon: Secondary | ICD-10-CM | POA: Insufficient documentation

## 2018-01-23 DIAGNOSIS — J45909 Unspecified asthma, uncomplicated: Secondary | ICD-10-CM | POA: Insufficient documentation

## 2018-01-27 ENCOUNTER — Encounter: Payer: Self-pay | Admitting: Physician Assistant

## 2018-02-16 ENCOUNTER — Encounter: Payer: Self-pay | Admitting: Physician Assistant

## 2018-03-25 ENCOUNTER — Telehealth: Payer: 59 | Admitting: Nurse Practitioner

## 2018-03-25 DIAGNOSIS — J0100 Acute maxillary sinusitis, unspecified: Secondary | ICD-10-CM | POA: Diagnosis not present

## 2018-03-25 MED ORDER — DOXYCYCLINE HYCLATE 100 MG PO TABS: 100.0000 mg | ORAL_TABLET | Freq: Two times a day (BID) | ORAL | 0 refills | Status: DC

## 2018-03-25 NOTE — Progress Notes (Signed)
We are sorry that you are not feeling well.  Here is how we plan to help!  Based on what you have shared with me it looks like you have sinusitis.  Sinusitis is inflammation and infection in the sinus cavities of the head.  Based on your presentation I believe you most likely have Acute Bacterial Sinusitis.  This is an infection caused by bacteria and is treated with antibiotics. I have prescribed Doxycycline 100mg  by mouth twice a day for 10 days. You may use an oral decongestant such as Mucinex D or if you have glaucoma or high blood pressure use plain Mucinex. Saline nasal spray help and can safely be used as often as needed for congestion.  If you develop worsening sinus pain, fever or notice severe headache and vision changes, or if symptoms are not better after completion of antibiotic, please schedule an appointment with a health care provider.    Sinus infections are not as easily transmitted as other respiratory infection, however we still recommend that you avoid close contact with loved ones, especially the very young and elderly.  Remember to wash your hands thoroughly throughout the day as this is the number one way to prevent the spread of infection! *treated with antibiotic due to reactive airway disease Home Care:  Only take medications as instructed by your medical team.  Complete the entire course of an antibiotic.  Do not take these medications with alcohol.  A steam or ultrasonic humidifier can help congestion.  You can place a towel over your head and breathe in the steam from hot water coming from a faucet.  Avoid close contacts especially the very young and the elderly.  Cover your mouth when you cough or sneeze.  Always remember to wash your hands.  Get Help Right Away If:  You develop worsening fever or sinus pain.  You develop a severe head ache or visual changes.  Your symptoms persist after you have completed your treatment plan.  Make sure you  Understand  these instructions.  Will watch your condition.  Will get help right away if you are not doing well or get worse.  Your e-visit answers were reviewed by a board certified advanced clinical practitioner to complete your personal care plan.  Depending on the condition, your plan could have included both over the counter or prescription medications.  If there is a problem please reply  once you have received a response from your provider.  Your safety is important to [place].  If you have drug allergies check your prescription carefully.    You can use MyChart to ask questions about today's visit, request a non-urgent call back, or ask for a work or school excuse for 24 hours related to this e-Visit. If it has been greater than 24 hours you will need to follow up with your provider, or enter a new e-Visit to address those concerns.  You will get an e-mail in the next two days asking about your experience.  I hope that your e-visit has been valuable and will speed your recovery. Thank you for using e-visits.  5 minutes spent reviewing and documenting in chart.

## 2018-04-03 ENCOUNTER — Encounter: Payer: Self-pay | Admitting: Physician Assistant

## 2018-04-03 DIAGNOSIS — G43109 Migraine with aura, not intractable, without status migrainosus: Secondary | ICD-10-CM

## 2018-04-03 DIAGNOSIS — R635 Abnormal weight gain: Secondary | ICD-10-CM

## 2018-04-05 ENCOUNTER — Encounter: Payer: Self-pay | Admitting: Physician Assistant

## 2018-04-06 MED ORDER — METFORMIN HCL ER 500 MG PO TB24: 500.0000 mg | ORAL_TABLET | Freq: Every day | ORAL | 2 refills | Status: DC

## 2018-04-06 MED ORDER — TOPIRAMATE 25 MG PO TABS: 75.0000 mg | ORAL_TABLET | Freq: Two times a day (BID) | ORAL | 0 refills | Status: DC

## 2018-06-23 ENCOUNTER — Encounter: Payer: Self-pay | Admitting: Physician Assistant

## 2018-07-01 NOTE — Telephone Encounter (Signed)
Scheduled with Dr Georgina Snell for Monday

## 2018-07-06 ENCOUNTER — Ambulatory Visit: Payer: 59 | Admitting: Family Medicine

## 2018-07-06 NOTE — Progress Notes (Deleted)
Adrienne Lara is a 53 y.o. female who presents to [place] today for back pain.  Patient has continued back pain.  She has been receiving physical therapy at [facility] rehab and chiropractic care at [place].  She is also been taking ibuprofen and naproxen.  She tried some leftover hydrocodone ***    ROS:  As above  Exam:  There were no vitals taken for this visit. Wt Readings from Last 5 Encounters:  01/22/18 164 lb (74.4 kg)  10/03/17 169 lb (76.7 kg)  09/23/17 167 lb (75.8 kg)  09/11/17 165 lb (74.8 kg)  08/28/17 167 lb 8 oz (76 kg)   General: Well Developed, well nourished, and in no acute distress.  Neuro/Psych: Alert and oriented x3, extra-ocular muscles intact, able to move all 4 extremities, sensation grossly intact. Skin: Warm and dry, no rashes noted.  Respiratory: Not using accessory muscles, speaking in full sentences, trachea midline.  Cardiovascular: Pulses palpable, no extremity edema. Abdomen: Does not appear distended. MSK: ***    Lab and Radiology Results Interface, Rad Results In - 04/22/2014  8:40 PM EDT MRI LUMBAR SPINE WITHOUT CONTRAST, Apr 22, 2014 11:29:56 AM . INDICATION:  back pain w/ shooting pain left groin, left hip, left post thigh w/ bladder pres  CCOMPARISON: None . TECHNIQUE: Multiplanar, multi-sequence surface-coil MR imaging of the lumbar spine was performed without contrast. . LEVELS IMAGED: Lower thoracic to upper sacral region . FINDINGS: .  Alignment: Within normal limits. .  Vertebrae: No marrow signal abnormalities to suggest neoplasm. .  Conus: Terminates at L1-L2.  Normal signal and contour.   .  Degenerative disc disease:. .  T12-L1: No significant focal abnormality. .  L1-L2: No significant focal abnormality. .  L2-L3: Facet hypertrophy without spinal canal stenosis or neural foraminal narrowing. [name]  L3-L4: Mild disc desiccation with minimal bulge, facet  hypertrophy and ligamentum flavum thickening without significant spinal canal stenosis or neural foraminal narrowing.  [name]  L4-L5: Facet hypertrophy without spinal canal stenosis or neural foramina narrowing. [name]  L5-S1: Right hemilaminectomy with decompression of the spinal canal. There is disc bulge and facet hypertrophy resulting in mild bilateral neural foramina narrowing. [name]  Upper Sacrum/Ilium: No significant focal abnormality. . CONCLUSION:  Mild multilevel degenerative disc disease with mild bilateral neural foraminal narrowing at L5-S1. No lumbar spinal canal stenosis.    Assessment and Plan: 53 y.o. female with ***   PDMP not reviewed this encounter. No orders of the defined types were placed in this encounter.  No orders of the defined types were placed in this encounter.   Historical information moved to improve visibility of documentation.  Past Medical History:  Diagnosis Date  . Allergy   . Anxiety   . Asthma   . Heart murmur    Past Surgical History:  Procedure Laterality Date  . APPENDECTOMY    . BREAST BIOPSY Right   . INTRAUTERINE DEVICE (IUD) INSERTION    . REDUCTION MAMMAPLASTY     Social History   Tobacco Use  . Smoking status: Former Research scientist (life sciences)  . Smokeless tobacco: Never Used  Substance Use Topics  . Alcohol use: Yes    Alcohol/week: 7.0 standard drinks    Types: 7 Standard drinks or equivalent per week   family history includes Alcohol abuse in her brother and sister; Breast cancer (age of onset: 77) in her mother; Heart attack in her sister; Obesity in her mother and sister.  Medications: Current Outpatient Medications  Medication Sig Dispense Refill  . albuterol (PROVENTIL HFA;VENTOLIN HFA) 108 (90 Base) MCG/ACT inhaler Inhale 1-2 puffs into the lungs every 4 (four) hours as needed for wheezing or shortness of breath (bronchospasm). 1 Inhaler 2  . DULoxetine (CYMBALTA) 20 MG capsule Take 1 capsule (20 mg total) by mouth 2 (two) times daily. Due  for follow up visit w/PCP 180 capsule 1  . ibuprofen (ADVIL,MOTRIN) 600 MG tablet Take 600 mg by mouth every 6 (six) hours as needed for migraine.    [name] ipratropium (ATROVENT) 0.06 % nasal spray Place 2 sprays into both nostrils 4 (four) times daily as needed for rhinitis. 15 mL 0  . levocetirizine (XYZAL ALLERGY 24HR) 5 MG tablet Take 1 tablet (5 mg total) by mouth every evening. 90 tablet 3  . levonorgestrel (MIRENA, 52 MG,) 20 MCG/24HR IUD by Intrauterine route.    . metFORMIN (GLUCOPHAGE XR) 500 MG 24 hr tablet Take 1 tablet (500 mg total) by mouth daily with supper. 30 tablet 2  . montelukast (SINGULAIR) 5 MG chewable tablet Chew 1 tablet (5 mg total) by mouth at bedtime. 90 tablet 3  . topiramate (TOPAMAX) 25 MG tablet Take 3 tablets (75 mg total) by mouth 2 (two) times daily. 540 tablet 0   No current facility-administered medications for this visit.    Allergies  Allergen Reactions  . Penicillins Hives  . Sulfa Antibiotics Hives  . Sulfonamide Derivatives       Discussed warning signs or symptoms. Please see discharge instructions. Patient expresses understanding.

## 2018-07-12 ENCOUNTER — Other Ambulatory Visit: Payer: Self-pay | Admitting: Physician Assistant

## 2018-07-15 ENCOUNTER — Encounter: Payer: Self-pay | Admitting: Physician Assistant

## 2018-07-23 ENCOUNTER — Encounter: Payer: Self-pay | Admitting: Physician Assistant

## 2018-07-23 ENCOUNTER — Other Ambulatory Visit: Payer: Self-pay

## 2018-07-23 ENCOUNTER — Ambulatory Visit (INDEPENDENT_AMBULATORY_CARE_PROVIDER_SITE_OTHER): Payer: 59 | Admitting: Physician Assistant

## 2018-07-23 VITALS — BP 130/85 | HR 90 | Temp 98.3°F | Wt 168.0 lb

## 2018-07-23 DIAGNOSIS — M5441 Lumbago with sciatica, right side: Secondary | ICD-10-CM

## 2018-07-23 DIAGNOSIS — Z131 Encounter for screening for diabetes mellitus: Secondary | ICD-10-CM

## 2018-07-23 DIAGNOSIS — G8929 Other chronic pain: Secondary | ICD-10-CM | POA: Insufficient documentation

## 2018-07-23 DIAGNOSIS — M25552 Pain in left hip: Secondary | ICD-10-CM

## 2018-07-23 DIAGNOSIS — R635 Abnormal weight gain: Secondary | ICD-10-CM | POA: Diagnosis not present

## 2018-07-23 DIAGNOSIS — M5442 Lumbago with sciatica, left side: Secondary | ICD-10-CM

## 2018-07-23 DIAGNOSIS — Z6829 Body mass index (BMI) 29.0-29.9, adult: Secondary | ICD-10-CM | POA: Diagnosis not present

## 2018-07-23 DIAGNOSIS — Z13220 Encounter for screening for lipoid disorders: Secondary | ICD-10-CM

## 2018-07-23 DIAGNOSIS — Z5181 Encounter for therapeutic drug level monitoring: Secondary | ICD-10-CM

## 2018-07-23 DIAGNOSIS — D1724 Benign lipomatous neoplasm of skin and subcutaneous tissue of left leg: Secondary | ICD-10-CM | POA: Insufficient documentation

## 2018-07-23 MED ORDER — METFORMIN HCL 850 MG PO TABS: 850.0000 mg | ORAL_TABLET | Freq: Three times a day (TID) | ORAL | 0 refills | Status: DC

## 2018-07-23 NOTE — Progress Notes (Signed)
HPI:                                                                Adrienne Lara is a 53 y.o. female who presents to [facility] today for medication management  Continues to endorse weight gain today. Weight is increased 4 lb compared to 6 months ago. Wt Readings from Last 3 Encounters:  07/23/18 168 lb (76.2 kg)  01/22/18 164 lb (74.4 kg)  10/03/17 169 lb (76.7 kg)  She is taking Topamax for comorbid migraines and Metformin once daily. Denies any adverse effects of medication She recently started [employer] program about 2 weeks ago and is doing 2 meal replacements and eating supper consisting of protein, vegetable and grain. She admits that she is cheating on her meal plan. She states she feels her main barrier to weight loss is exercise. She reports she is unable to exercise because gyms are closed but namely she is having left-sided low back and hip pain that flares up with activity. She admits she is able to ride a bike without pain, but she doesn't really enjoy bike riding. Reports healthy adult weight is 129 lb. Last weighed this in 2017  Left Hip Pain - chronic, since 2017. Pain is moderate, persistent, located in the lateral hip and radiates into the lateral thigh. It rarely radiates into her lateral calf. She states she had an injection several years ago that provided months of pain relief.   LBP - chronic, since 2016. Left-sided, waxing and waning. Reports hx of L5-S1 arthritis. She has tried 1-2 sessions of PT, reports it made symptoms worse. Has also tried inversion table, which is mildly helpful. Worse with walking and physical activity. Denies saddle numbness, bowel/bladder dysfunction, lower extremity weakness. She had an MRI in April 2016 which showed mild multilevel DDD.  She also reports a lump of her left anteiror thigh that has been present for years. It does not bother her. Has not changed in size or firmness.  Nontender.  Past Medical History:  Diagnosis Date  . Allergy   . Anxiety   . Asthma   . Heart murmur    Past Surgical History:  Procedure Laterality Date  . APPENDECTOMY    . BREAST BIOPSY Right   . INTRAUTERINE DEVICE (IUD) INSERTION    . REDUCTION MAMMAPLASTY     Social History   Tobacco Use  . Smoking status: Former Research scientist (life sciences)  . Smokeless tobacco: Never Used  Substance Use Topics  . Alcohol use: Yes    Alcohol/week: 7.0 standard drinks    Types: 7 Standard drinks or equivalent per week   family history includes Alcohol abuse in her brother and sister; Breast cancer (age of onset: 31) in her mother; Heart attack in her sister; Obesity in her mother and sister.    ROS: negative except as noted in the HPI  Medications: Current Outpatient Medications  Medication Sig Dispense Refill  . albuterol (PROVENTIL HFA;VENTOLIN HFA) 108 (90 Base) MCG/ACT inhaler Inhale 1-2 puffs into the lungs every 4 (four) hours as needed for wheezing or shortness of breath (bronchospasm). 1 Inhaler 2  . DULoxetine (CYMBALTA) 20 MG capsule Take 1 capsule (20 mg total) by mouth 2 (two) times daily. Due for  follow up visit w/PCP 180 capsule 1  . levonorgestrel (MIRENA, 52 MG,) 20 MCG/24HR IUD by Intrauterine route.    . montelukast (SINGULAIR) 5 MG chewable tablet Chew 1 tablet (5 mg total) by mouth at bedtime. 90 tablet 3  . topiramate (TOPAMAX) 25 MG tablet Take 3 tablets (75 mg total) by mouth 2 (two) times daily. 540 tablet 0  . metFORMIN (GLUCOPHAGE) 850 MG tablet Take 1 tablet (850 mg total) by mouth 3 (three) times daily with meals. 270 tablet 0   No current facility-administered medications for this visit.    Allergies  Allergen Reactions  . Penicillins Hives  . Sulfa Antibiotics Hives  . Sulfonamide Derivatives        Objective:  BP 130/85   Pulse 90   Temp 98.3 F (36.8 C) (Oral)   Wt 168 lb (76.2 kg)   BMI 29.76 kg/m  Gen:  alert, not ill-appearing, no distress, appropriate  for age 55: head normocephalic without obvious abnormality, conjunctiva and cornea clear, trachea midline Pulm: Normal work of breathing, normal phonation Neuro: alert and oriented x 3, no tremor MSK: extremities atraumatic, normal gait and station, no peripheral edema Skin: intact, no rashes on exposed skin, no jaundice, no cyanosis, approx 2 cm soft tissue mass of left anterior thigh Psych: well-groomed, cooperative, good eye contact, euthymic mood, affect mood-congruent, speech is articulate, and thought processes clear and goal-directed  BP Readings from Last 3 Encounters:  07/23/18 130/85  01/22/18 (!) 145/80  10/03/17 123/77   Wt Readings from Last 3 Encounters:  07/23/18 168 lb (76.2 kg)  01/22/18 164 lb (74.4 kg)  10/03/17 169 lb (76.7 kg)   Lab Results  Component Value Date   CREATININE 1.01 09/11/2017   BUN 11 09/11/2017   NA 136 09/11/2017   K 4.0 09/11/2017   CL 104 09/11/2017   CO2 23 09/11/2017   Lab Results  Component Value Date   ALT 13 09/11/2017   AST 17 09/11/2017   BILITOT 0.8 09/11/2017   No results found for: HGBA1C Lab Results  Component Value Date   WBC 4.8 09/11/2017   HGB 14.1 09/11/2017   HCT 41.5 09/11/2017   MCV 88.3 09/11/2017   PLT 266 09/11/2017   Lab Results  Component Value Date   CHOL 211 (H) 09/11/2017   HDL 62 09/11/2017   LDLCALC 133 (H) 09/11/2017   TRIG 65 09/11/2017   CHOLHDL 3.4 09/11/2017   Xr Hip Min 2 Views Left7/14/2017 [facility] Result Narrative  [id number] Attending ER:[id number] SMITH [id number] Ordering KD:TOIZTI SMITH Date of Birth:[birth year]Sex: F Admit Date:08/04/2015 12:31  ###FINAL RESULT###    INDICATIONS: LEFT HIP PAIN X 6MONTHS INTERMITTANT WHILE RUNNING, POST TO  ANT GROIN COMMENTS:   PROCEDURE:QXR 3069- HIP 2V LT - Aug 04 2015  [employer] Accession #: [id number] [facility] Accession #: [id number]     TECHNIQUE:HIP 2V LT -  COMPARISON:None  INDICATION: LEFT HIP PAIN  X 6MONTHS INTERMITTANT WHILE RUNNING, POST TO  ANT GROIN  Exam date/time:08/04/2015 12:30 PM  FINDINGS:  #Proximal femur, right hip joint and visualized pelvis, or intact. #An IUD is in place. #. Pelvic calcifications, likely phleboliths. These probably are  present KUB from 01/15/2005. #No obvious hip effusion. Mild sclerosis in the region of the sciatic  notch, likely benign but can be seen in Pagets disease, as well as  ileitis condensans ilii. Sacral neuroforamen are slightly prominent but  also stable in appearance. 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    IMPRESSION: 1.No acute findings. Small indeterminate pelvic calcifications ,  probably phleboliths as above. 2.Mild sclerosis in the ilium and ischium, probably also benign. See  comments above.    MR SPINE LUMBAR WO CONTRAST4/01/2014 [facility] Result Impression   Mild multilevel degenerative disc disease with mild bilateral neural foraminal narrowing at L5-S1. No lumbar spinal canal stenosis.  Result Narrative  MRI LUMBAR SPINE WITHOUT CONTRAST, Apr 22, 2014 11:29:56 AM . INDICATION:back pain w/ shooting pain left groin, left hip, left post thigh w/ bladder pres  CCOMPARISON: None . TECHNIQUE: Multiplanar, multi-sequence surface-coil MR imaging of the lumbar spine was performed without contrast. . LEVELS IMAGED: Lower thoracic to upper sacral region . FINDINGS: .Alignment: Within normal limits. .Vertebrae: No marrow signal abnormalities to suggest neoplasm. .Conus: Terminates at L1-L2.Normal signal and contour. .Degenerative disc disease:. .T12-L1: No significant focal abnormality. .L1-L2: No significant focal abnormality. .L2-L3: Facet hypertrophy without spinal canal stenosis or neural foraminal narrowing. Marland KitchenL3-L4: Mild disc desiccation with minimal bulge, facet hypertrophy and ligamentum flavum  thickening without significant spinal canal stenosis or neural foraminal narrowing.  Marland KitchenL4-L5: Facet hypertrophy without spinal canal stenosis or neural foramina narrowing. Marland KitchenL5-S1: Right hemilaminectomy with decompression of the spinal canal. There is disc bulge and facet hypertrophy resulting in mild bilateral neural foramina narrowing. Marland KitchenUpper Sacrum/Ilium: No significant focal abnormality. .    No results found for this or any previous visit (from the past 72 hour(s)). No results found.    Assessment and Plan: 53 y.o. female with   .Diagnoses and all orders for this visit:  Chronic left-sided low back pain with bilateral sciatica -     DG Lumbar Spine Complete  Chronic left hip pain -     DG Hip Unilat W OR W/O Pelvis 2-3 Views Left -     DG Lumbar Spine Complete  Abnormal weight gain -     metFORMIN (GLUCOPHAGE) 850 MG tablet; Take 1 tablet (850 mg total) by mouth 3 (three) times daily with meals. -     COMPLETE METABOLIC PANEL WITH GFR -     CBC -     Lipid Panel w/reflex Direct LDL  Adult BMI 29.0-29.9 kg/sq m -     metFORMIN (GLUCOPHAGE) 850 MG tablet; Take 1 tablet (850 mg total) by mouth 3 (three) times daily with meals. -     COMPLETE METABOLIC PANEL WITH GFR -     CBC -     Lipid Panel w/reflex Direct LDL  Screening for lipid disorders -     Lipid Panel w/reflex Direct LDL  Screening for diabetes mellitus -     COMPLETE METABOLIC PANEL WITH GFR  Medication monitoring encounter -     COMPLETE METABOLIC PANEL WITH GFR -     CBC -     Lipid Panel w/reflex Direct LDL  Lipoma of left thigh   Chronic left hip pain/Left-sided LBP ?radiculitis Pain interfering with activity level. Has failed modest conservative measures (several PT sessions and inversion table) Prior imaging reports reviewed X-rays pending today Follow-up with Dr. Georgina Snell to discuss hip injection  Weight gain BMI 29 today Discussed activity modification to avoid provoking hip/back pain to  include biking and body weight exercises such as TRX Will increase Metformin - cautioned patient on taking with meal replacement. Start with 850 mg bid and increase after 1-2 weeks up to tid if tolerated Would not recommend increasing Topamax due to risk of metabolic disturbance and cognitive  side effects Counseled on signs/symptoms of metabolic acidosis Will check CMP in 2-4 weeks   Patient education and anticipatory guidance given Patient agrees with treatment plan Follow-up in 6 weeks for weight check or sooner as needed if symptoms worsen or fail to improve  I spent 25 minutes with this patient, greater than 50% was face-to-face time counseling regarding the above diagnoses  Darlyne Russian PA-C

## 2018-07-27 ENCOUNTER — Ambulatory Visit: Payer: 59 | Admitting: Family Medicine

## 2018-07-27 ENCOUNTER — Ambulatory Visit (INDEPENDENT_AMBULATORY_CARE_PROVIDER_SITE_OTHER): Payer: 59

## 2018-07-27 ENCOUNTER — Other Ambulatory Visit: Payer: Self-pay

## 2018-07-27 ENCOUNTER — Encounter: Payer: Self-pay | Admitting: Family Medicine

## 2018-07-27 VITALS — BP 128/65 | HR 73 | Temp 98.9°F | Ht 64.0 in | Wt 169.0 lb

## 2018-07-27 DIAGNOSIS — M7062 Trochanteric bursitis, left hip: Secondary | ICD-10-CM | POA: Diagnosis not present

## 2018-07-27 DIAGNOSIS — G8929 Other chronic pain: Secondary | ICD-10-CM

## 2018-07-27 DIAGNOSIS — M5441 Lumbago with sciatica, right side: Secondary | ICD-10-CM | POA: Diagnosis not present

## 2018-07-27 DIAGNOSIS — M25552 Pain in left hip: Secondary | ICD-10-CM

## 2018-07-27 DIAGNOSIS — M4306 Spondylolysis, lumbar region: Secondary | ICD-10-CM

## 2018-07-27 DIAGNOSIS — M5442 Lumbago with sciatica, left side: Secondary | ICD-10-CM | POA: Diagnosis not present

## 2018-07-27 NOTE — Patient Instructions (Signed)
Thank you for coming in today. Get xray now.  Attend PT.  Recheck in 4 weeks.  Return sooner if needed.  Goal is to increase stretch and come up with ongoing home exercises.

## 2018-07-27 NOTE — Progress Notes (Signed)
Subjective:    I'm seeing this patient as a consultation for:  Raynah, Gomes, PA-C   CC: Hip pain  HPI: [name] has a several year history of back and hip pain.  She has had evaluations including x-rays and MRIs of her lumbar spine.  She is never had surgery although her MRI most recently of her lumbar spine is consistent with hemilaminectomy.  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  She notes predominantly pain in the left lateral hip and buttocks.  Sometimes the pain radiates down to the lateral thigh.  Occasionally she will have pain in the lateral calf.  Pain is worse with activity and better with rest.  She is had physical therapy which works temporarily to control her pain but the pain returns.  Her pain is worse with exercise and activity.  She previously was running marathons and half marathons most recently in 2016.  She has been unable to return to running since because of pain.  This is caused significant impact on her quality of life.  She is had weight gain as a result.  She is unable to exercise normally because of her pain.  She denies any weakness or numbness distally.  The majority of her pain is in the lateral hip.  She does not have much pain in her low back or her lateral calf.     Past medical history, Surgical history, Family history not pertinant except as noted below, Social history, Allergies, and medications have been entered into the medical record, reviewed, and no changes needed.   Review of Systems: No headache, visual changes, nausea, vomiting, diarrhea, constipation, dizziness, abdominal pain, skin rash, fevers, chills, night sweats, weight loss, swollen lymph nodes, body aches, joint swelling, muscle aches, chest pain, shortness of breath, mood changes, visual or auditory hallucinations.   Objective:    Vitals:   07/27/18 0925  BP: 128/65  Pulse: 73  Temp: 98.9 F (37.2 C)  SpO2: 99%   General: Well  Developed, well nourished, and in no acute distress.  Neuro/Psych: Alert and oriented x3, extra-ocular muscles intact, able to move all 4 extremities, sensation grossly intact. Skin: Warm and dry, no rashes noted.  Respiratory: Not using accessory muscles, speaking in full sentences, trachea midline.  Cardiovascular: Pulses palpable, no extremity edema. Abdomen: Does not appear distended. MSK:  L-spine nontender to spinal midline normal lumbar motion. Left hip normal-appearing normal motion. Tender to palpation greater trochanter.  Hip abduction strength significantly diminished 3/5. External rotation strength diminished 4/5.  Internal rotation and adduction and normal. Negative Faber and Fadir test.   Right hip normal-appearing nontender normal motion.  Hip adduction strength 4+/5.  External rotation strength normal internal rotation strength and hip adduction strength normal. Negative Faber and Fadir test.  Normal gait.  Lab and Radiology Results X-ray images L-spine and left hip personally independently reviewed. L-spine: Degenerative changes at L5-S1 and L4-L5 with possible pars defect without spondylolisthesis at L5-S1.  No acute fractures. Left hip: No significant degenerative changes.  No fractures visible. Await formal radiology review  Impression and Recommendations:    Assessment and Plan: 53 y.o. female with left lateral hip pain.  Associate with significant hip abduction weakness and pain.  Very likely hip abductor tendinopathy or greater trochanteric bursitis.  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  She also has a possible L5 radiculopathy.  I do not think  this contributes much to her current pain today.  Plan to proceed with more dedicated physical therapy focused more on trochanteric bursitis.  Recheck back in 4 weeks if not improving next that may be MRI.[name]  PDMP not reviewed this encounter. Orders Placed  This Encounter  Procedures  . Ambulatory referral to Physical Therapy    Referral Priority:   Routine    Referral Type:   Physical Medicine    Referral Reason:   Specialty Services Required    Requested Specialty:   Physical Therapy   No orders of the defined types were placed in this encounter.   Discussed warning signs or symptoms. Please see discharge instructions. Patient expresses understanding.

## 2018-07-28 LAB — COMPLETE METABOLIC PANEL WITH GFR
AG Ratio: 1.6 (calc) (ref 1.0–2.5)
ALT: 14 U/L (ref 6–29)
AST: 20 U/L (ref 10–35)
Albumin: 4.1 g/dL (ref 3.6–5.1)
Alkaline phosphatase (APISO): 45 U/L (ref 37–153)
BUN: 12 mg/dL (ref 7–25)
CO2: 22 mmol/L (ref 20–32)
Calcium: 9.1 mg/dL (ref 8.6–10.4)
Chloride: 106 mmol/L (ref 98–110)
Creat: 0.82 mg/dL (ref 0.50–1.05)
GFR, Est African American: 95 mL/min/{1.73_m2} (ref >=60)
GFR, Est Non African American: 82 mL/min/{1.73_m2} (ref >=60)
Globulin: 2.6 g/dL (calc) (ref 1.9–3.7)
Glucose, Bld: 95 mg/dL (ref 65–99)
Potassium: 3.8 mmol/L (ref 3.5–5.3)
Sodium: 136 mmol/L (ref 135–146)
Total Bilirubin: 0.4 mg/dL (ref 0.2–1.2)
Total Protein: 6.7 g/dL (ref 6.1–8.1)

## 2018-07-28 LAB — CBC
HCT: 37.5 % (ref 35.0–45.0)
Hemoglobin: 12.8 g/dL (ref 11.7–15.5)
MCH: 30.8 pg (ref 27.0–33.0)
MCHC: 34.1 g/dL (ref 32.0–36.0)
MCV: 90.1 fL (ref 80.0–100.0)
MPV: 9.9 fL (ref 7.5–12.5)
Platelets: 262 10*3/uL (ref 140–400)
RBC: 4.16 10*6/uL (ref 3.80–5.10)
RDW: 12.7 % (ref 11.0–15.0)
WBC: 4.4 10*3/uL (ref 3.8–10.8)

## 2018-07-28 LAB — LIPID PANEL W/REFLEX DIRECT LDL
Cholesterol: 186 mg/dL (ref <200)
HDL: 68 mg/dL (ref >=50)
LDL Cholesterol (Calc): 104 mg/dL (calc) — ABNORMAL HIGH
Non-HDL Cholesterol (Calc): 118 mg/dL (calc) (ref <130)
Total CHOL/HDL Ratio: 2.7 (calc) (ref <5.0)
Triglycerides: 55 mg/dL (ref <150)

## 2018-08-05 ENCOUNTER — Ambulatory Visit: Payer: 59 | Admitting: Rehabilitative and Restorative Service Providers"

## 2018-08-10 ENCOUNTER — Ambulatory Visit (INDEPENDENT_AMBULATORY_CARE_PROVIDER_SITE_OTHER): Payer: 59 | Admitting: Physical Therapy

## 2018-08-10 ENCOUNTER — Encounter: Payer: Self-pay | Admitting: Physical Therapy

## 2018-08-10 ENCOUNTER — Other Ambulatory Visit: Payer: Self-pay

## 2018-08-10 DIAGNOSIS — R2689 Other abnormalities of gait and mobility: Secondary | ICD-10-CM

## 2018-08-10 DIAGNOSIS — M25552 Pain in left hip: Secondary | ICD-10-CM

## 2018-08-10 DIAGNOSIS — M6281 Muscle weakness (generalized): Secondary | ICD-10-CM | POA: Diagnosis not present

## 2018-08-10 NOTE — Therapy (Signed)
[facility], [place], [address] Phone: [phone]   Fax:  [phone]  Physical Therapy Evaluation  Patient Details  Name: Adrienne Lara MRN: [id number] Date of Birth: 03/12/1965 Referring Provider (PT): Dr. Lynne Leader   Encounter Date: 08/10/2018  PT End of Session - 08/10/18 1445    Visit Number  1    Number of Visits  12    Date for PT Re-Evaluation  09/21/18    Authorization Type  [employer]    PT Start Time  0240    PT Stop Time  1328    PT Time Calculation (min)  53 min    Activity Tolerance  Patient tolerated treatment well    Behavior During Therapy  [facility] for tasks assessed/performed       Past Medical History:  Diagnosis Date  . Allergy   . Anxiety   . Asthma   . Heart murmur     Past Surgical History:  Procedure Laterality Date  . APPENDECTOMY    . BREAST BIOPSY Right   . INTRAUTERINE DEVICE (IUD) INSERTION    . REDUCTION MAMMAPLASTY      There were no vitals filed for this visit.   Subjective Assessment - 08/10/18 1440    Subjective  Pt states increased pain in L lateral hip and down thigh. She had previous/similar pain about 2 years ago, PT was helpful. She was able to return to running, for about 6 months, but states pain returned. She states some frustration for not being abe to exercise, weight gain, due to pain. She would like to return to running when able. She has Neg Hip x-ray, and neg back x-ray. She has had previous back pain in the past.    Limitations  Sitting;Lifting;Standing;Walking;House hold activities    Patient Stated Goals  Decrease pain, return to running.    Currently in Pain?  Yes    Pain Score  5     Pain Location  Hip    Pain Orientation  Left    Pain Descriptors / Indicators  Aching;Tightness    Pain Type  Chronic pain    Pain Onset  More than a month ago    Pain Frequency  Intermittent    Aggravating Factors   standing, running, increased  standing activity    Pain Relieving Factors  rest, stretching         [facility] PT Assessment - 08/10/18 0001      Assessment   Medical Diagnosis  L hip Pain    Referring Provider (PT)  Dr. Lynne Leader    Next MD Visit  after PT    Prior Therapy  Yes/2 years ago      Balance Screen   Has the patient fallen in the past 6 months  No      Prior Function   Level of Independence  Independent      Cognition   Overall Cognitive Status  Within Functional Limits for tasks assessed      Posture/Postural Control   Posture Comments  Standing: slight thoracic/lumbar scoliosis, hips/ASIS even;   Foot postue: neutral      ROM / Strength   AROM / PROM / Strength  AROM;Strength      AROM   Overall AROM Comments  Lumbar: WNL, except L SB mild limitation and pain in L hip;    L hip: WFL, mild limitation and pain with IR;       Strength  Overall Strength Comments  R hip: 4/5;  L Hip: 4-/5 ;  Knee: 5/5       Palpation   Palpation comment  Pain in L greater trochanter, into L ITB, Mild tenderness in glute med.  Mild tenderness at hip flexor      Special Tests   Other special tests  Pain with FADIR, Neg SLR,                Objective measurements completed on examination: See above findings.      [facility] Adult PT Treatment/Exercise - 08/10/18 0001      Exercises   Exercises  Knee/Hip      Knee/Hip Exercises: Stretches   Hip Flexor Stretch  3 reps;30 seconds    Hip Flexor Stretch Limitations  kneeling    ITB Stretch  2 reps;30 seconds    ITB Stretch Limitations  supine with strap//  and standing with forward flexion     Piriformis Stretch  3 reps;30 seconds;Left    Piriformis Stretch Limitations  Supine fig 4    Other Knee/Hip Stretches  piriformis:  supine with knee across body in IR 30 sec x3 on L;       Knee/Hip Exercises: Supine   Bridges  15 reps      Knee/Hip Exercises: Sidelying   Hip ABduction  1 set;10 reps;Both    Clams  x10 bil      Manual Therapy   Manual  Therapy  Joint mobilization    Joint Mobilization  Long leg distraction on L; Hip Inf and Ant mobs gr 3;              PT Education - 08/10/18 1445    Education Details  PT POC, HEP, exam findings.    Person(s) Educated  Patient    Methods  Explanation;Demonstration;Tactile cues;Verbal cues;Handout    Comprehension  Verbalized understanding;Returned demonstration;Verbal cues required;Tactile cues required;Need further instruction       PT Short Term Goals - 08/10/18 1448      PT SHORT TERM GOAL #1   Title  Pt to be independent with initial HEP    Time  2    Period  Weeks    Status  New    Target Date  08/24/18      PT SHORT TERM GOAL #2   Title  Pt to report decreased pain in L hip, to 4/10 with activity    Time  2    Period  Weeks    Status  New    Target Date  08/24/18        PT Long Term Goals - 08/10/18 1448      PT LONG TERM GOAL #1   Title  Pt to be independent with final HEP for strength and stabilization of L hip and core    Time  6    Period  Weeks    Status  New    Target Date  09/21/18      PT LONG TERM GOAL #2   Title  Pt to demo increased strength of L hip, to be at least 4+/5 to improve stability and pain.    Time  6    Period  Weeks    Status  New    Target Date  09/21/18      PT LONG TERM GOAL #3   Title  Pt to report decreased pain to 0-2/10 in L hip,  with standing activity.    Time  6    Period  Weeks    Status  New    Target Date  09/21/18      PT LONG TERM GOAL #4   Title  Pt to demo ability for SLS with dynamic movement, with stability WNL/minimal sway, and pain 0-2/10    Time  6    Period  Weeks    Status  New    Target Date  09/21/18             Plan - 08/10/18 1452    Clinical Impression Statement  Pt presents with primary complaint of increased pain in L hip. Pt with most pain and tenderness in lateral hip, gr trochanter region and into ITB. ALso with some pain in Anterior hip, and mild pain into L glute. Pt with  poor strength and stability of L hip, which is likely cause of over use and pain. Pt to benefit from focus on strength and stabilization for treatment. Pt with decreased endurance for functional activities, standing, walking, and decreased ability for exercise, due to pain. Pt to benefit from skilled PT to improve pain, strength and ability for functional activity. Pt with positive outcome from PT course a couple years ago.    Personal Factors and Comorbidities  Past/Current Experience    Examination-Activity Limitations  Locomotion Level;Carry;Squat;Stairs;Lift;Stand    Examination-Participation Restrictions  Cleaning;Community Activity;Laundry;Yard Work    Stability/Clinical Decision Making  Stable/Uncomplicated    Clinical Decision Making  Low    Rehab Potential  Good    PT Frequency  2x / week    PT Duration  6 weeks    PT Treatment/Interventions  ADLs/Self Care Home Management;Cryotherapy;Electrical Stimulation;DME Instruction;Ultrasound;Traction;Moist Heat;Iontophoresis 4mg /ml Dexamethasone;Gait training;Stair training;Functional mobility training;Therapeutic activities;Therapeutic exercise;Balance training;Orthotic Fit/Training;Patient/family education;Neuromuscular re-education;Manual techniques;Passive range of motion;Dry needling;Spinal Manipulations;Vasopneumatic Device;Taping;Joint Manipulations    PT Next Visit Plan  Manual for L hip, (anterior/lateral) May require DN for glute, Strength, stabilization, progression to standing position as able    PT Home Exercise Plan  [id number]    Consulted and Agree with Plan of Care  Patient       Patient will benefit from skilled therapeutic intervention in order to improve the following deficits and impairments:  Abnormal gait, Difficulty walking, Increased muscle spasms, Decreased endurance, Decreased activity tolerance, Pain, Decreased balance, Impaired flexibility, Improper body mechanics, Decreased strength, Decreased mobility  Visit  Diagnosis: 1. Pain in left hip   2. Muscle weakness (generalized)   3. Other abnormalities of gait and mobility        Problem List Patient Active Problem List   Diagnosis Date Noted  . 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 Chronic left hip pain 07/23/2018  . Lipoma of left thigh 07/23/2018  . Reactive airway disease 01/23/2018  . Colon cancer screening 01/23/2018  . Encounter for long-term (current) use of medications 01/23/2018  . Overweight with body mass index (BMI) 25.0-29.9 09/23/2017  . Secondary amenorrhea 08/25/2017  . Breast cancer screening, high risk patient 08/25/2017  . Family history of breast cancer in mother 08/25/2017  . White coat syndrome without diagnosis of hypertension 08/25/2017  . Abnormal weight gain 08/25/2017  . Migraine with aura and without status migrainosus, not intractable 08/25/2017  . Anxiety disorder 08/25/2017  . IUD (intrauterine device) in place 08/16/2015  . Mild intermittent asthma, uncomplicated 11/94/1740  . [place] DISEASE, CERVICAL 12/30/2006  . NEVUS, ATYPICAL 11/10/2006  . SINUSITIS, ACUTE NOS 10/23/2006  . EXCESSIVE MENSTRUATION 10/23/2006  . MYALGIA  10/23/2006  . HYPERLIPIDEMIA 12/26/2005  . DISORDER, DYSTHYMIC 12/26/2005  . PAIN IN THORACIC SPINE 12/26/2005  . ANXIETY 12/23/2005  . Perennial allergic rhinitis 12/23/2005  . NECK PAIN 12/23/2005  . HEADACHE 12/23/2005    Lyndee Hensen, PT, DPT 3:21 PM  08/10/18    [facility] [place], [place], [address] Phone: [phone]   Fax:  [phone]  Name: Adrienne Lara MRN: [id number] Date of Birth: 01/13/1966

## 2018-08-10 NOTE — Patient Instructions (Signed)
Access Code: [email or web address]  URL: [email or web address]  Date: 08/10/2018  Prepared by: Lyndee Hensen   Exercises Supine Bridge - 10 reps - 2 sets - 2x daily Sidelying Hip Abduction - 10 reps - 2 sets - 2x daily Clamshell - 10 reps - 2 sets - 2x daily Supine Piriformis Stretch - 3 reps - 30 hold - 2x daily Supine Piriformis Stretch with Leg Straight - 3 reps - 30 hold - 2x daily Half Kneeling Hip Flexor Stretch - 3 reps - 30 hold - 2x daily

## 2018-08-12 ENCOUNTER — Ambulatory Visit (INDEPENDENT_AMBULATORY_CARE_PROVIDER_SITE_OTHER): Payer: 59 | Admitting: Physical Therapy

## 2018-08-12 ENCOUNTER — Encounter: Payer: Self-pay | Admitting: Physical Therapy

## 2018-08-12 ENCOUNTER — Other Ambulatory Visit: Payer: Self-pay

## 2018-08-12 DIAGNOSIS — M6281 Muscle weakness (generalized): Secondary | ICD-10-CM | POA: Diagnosis not present

## 2018-08-12 DIAGNOSIS — R2689 Other abnormalities of gait and mobility: Secondary | ICD-10-CM | POA: Diagnosis not present

## 2018-08-12 DIAGNOSIS — M25552 Pain in left hip: Secondary | ICD-10-CM

## 2018-08-12 NOTE — Therapy (Signed)
[place], [place], [address] Phone: [phone]   Fax:  [phone]  Physical Therapy Treatment  Patient Details  Name: Adrienne Lara MRN: [id number] Date of Birth: September 26, 1965 Referring Provider (PT): Dr. Lynne Leader   Encounter Date: 08/12/2018  PT End of Session - 08/12/18 0813    Visit Number  2    Number of Visits  12    Date for PT Re-Evaluation  09/21/18    Authorization Type  UHC    PT Start Time  0805   pt arrived late   PT Stop Time  0845    PT Time Calculation (min)  40 min    Activity Tolerance  Patient tolerated treatment well       Past Medical History:  Diagnosis Date  . Allergy   . Anxiety   . Asthma   . Heart murmur     Past Surgical History:  Procedure Laterality Date  . APPENDECTOMY    . BREAST BIOPSY Right   . INTRAUTERINE DEVICE (IUD) INSERTION    . REDUCTION MAMMAPLASTY      There were no vitals filed for this visit.  Subjective Assessment - 08/12/18 0814    Subjective  She states she walked 1.25 miles and "felt it" in her hip/low back (3/10) pain.  She did her exercises and she notes it was difficult to complete on Lt side.    Limitations  Sitting;Lifting;Standing;Walking;House hold activities    Patient Stated Goals  Decrease pain, return to running.    Currently in Pain?  Yes    Pain Score  1     Pain Location  Hip    Pain Orientation  Left    Pain Descriptors / Indicators  Dull    Aggravating Factors   running    Pain Relieving Factors  rest, stretching         [facility] PT Assessment - 08/12/18 0001      Assessment   Medical Diagnosis  L hip Pain    Referring Provider (PT)  Dr. Lynne Leader    Next MD Visit  after PT    Prior Therapy  Yes/2 years ago       [facility] Adult PT Treatment/Exercise - 08/12/18 0001      Knee/Hip Exercises: Stretches   Passive Hamstring Stretch  Left;2 reps;20 seconds    Hip Flexor Stretch  Left;Right;2 reps;30  seconds    Hip Flexor Stretch Limitations  seated, 1 rep with arm overhead     Piriformis Stretch  Left;3 reps;30 seconds    Piriformis Stretch Limitations  2 reps of modifed and reg pigeon pose.       Knee/Hip Exercises: Aerobic   Recumbent Bike  L2:4.5 min      Knee/Hip Exercises: Sidelying   Hip ABduction  1 set;10 reps;Left    Clams  x10 Lt with core engaged; repeated for reverse clams x 10 Lt      Modalities   Modalities  Iontophoresis      Iontophoresis   Type of Iontophoresis  Dexamethasone    Location  Lt greater trochanter     Dose  1.0 cc     Time  80 mA stat patch, 6 hr       Manual Therapy   Manual therapy comments  pt in Rt sidelying;  IASTM to lateral/ posterior hip and TPR to Lt glute med/min        PT Short Term Goals -  08/10/18 1448      PT SHORT TERM GOAL #1   Title  Pt to be independent with initial HEP    Time  2    Period  Weeks    Status  New    Target Date  08/24/18      PT SHORT TERM GOAL #2   Title  Pt to report decreased pain in L hip, to 4/10 with activity    Time  2    Period  Weeks    Status  New    Target Date  08/24/18        PT Long Term Goals - 08/10/18 1448      PT LONG TERM GOAL #1   Title  Pt to be independent with final HEP for strength and stabilization of L hip and core    Time  6    Period  Weeks    Status  New    Target Date  09/21/18      PT LONG TERM GOAL #2   Title  Pt to demo increased strength of L hip, to be at least 4+/5 to improve stability and pain.    Time  6    Period  Weeks    Status  New    Target Date  09/21/18      PT LONG TERM GOAL #3   Title  Pt to report decreased pain to 0-2/10 in L hip,  with standing activity.    Time  6    Period  Weeks    Status  New    Target Date  09/21/18      PT LONG TERM GOAL #4   Title  Pt to demo ability for SLS with dynamic movement, with stability WNL/minimal sway, and pain 0-2/10    Time  6    Period  Weeks    Status  New    Target Date  09/21/18             Plan - 08/12/18 0853    Clinical Impression Statement  Pt reported increase in Lt hip pain with strengthening exercises, however tolerable so pt able to complete set.  Trial of ionto patch initiated. Goals are ongoing.    Rehab Potential  Good    PT Frequency  2x / week    PT Duration  6 weeks    PT Treatment/Interventions  ADLs/Self Care Home Management;Cryotherapy;Electrical Stimulation;DME Instruction;Ultrasound;Traction;Moist Heat;Iontophoresis 4mg /ml Dexamethasone;Gait training;Stair training;Functional mobility training;Therapeutic activities;Therapeutic exercise;Balance training;Orthotic Fit/Training;Patient/family education;Neuromuscular re-education;Manual techniques;Passive range of motion;Dry needling;Spinal Manipulations;Vasopneumatic Device;Taping;Joint Manipulations    PT Next Visit Plan  Manual for L hip, (anterior/lateral) May require DN for glute, Strength, stabilization, progression to standing position as able.  Assess response to ionto.    PT Home Exercise Plan  [email or web address]    Consulted and Agree with Plan of Care  Patient       Patient will benefit from skilled therapeutic intervention in order to improve the following deficits and impairments:  Abnormal gait, Difficulty walking, Increased muscle spasms, Decreased endurance, Decreased activity tolerance, Pain, Decreased balance, Impaired flexibility, Improper body mechanics, Decreased strength, Decreased mobility  Visit Diagnosis: 1. Pain in left hip   2. Muscle weakness (generalized)   3. Other abnormalities of gait and mobility        Problem List Patient Active Problem List   Diagnosis Date Noted  . 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 Chronic left hip pain 07/23/2018  .  Lipoma of left thigh 07/23/2018  . Reactive airway disease 01/23/2018  . Colon cancer screening 01/23/2018  . Encounter for long-term (current) use of medications 01/23/2018  . Overweight with body  mass index (BMI) 25.0-29.9 09/23/2017  . Secondary amenorrhea 08/25/2017  . Breast cancer screening, high risk patient 08/25/2017  . Family history of breast cancer in mother 08/25/2017  . White coat syndrome without diagnosis of hypertension 08/25/2017  . Abnormal weight gain 08/25/2017  . Migraine with aura and without status migrainosus, not intractable 08/25/2017  . Anxiety disorder 08/25/2017  . IUD (intrauterine device) in place 08/16/2015  . Mild intermittent asthma, uncomplicated 49/35/5217  . [place] DISEASE, CERVICAL 12/30/2006  . NEVUS, ATYPICAL 11/10/2006  . SINUSITIS, ACUTE NOS 10/23/2006  . EXCESSIVE MENSTRUATION 10/23/2006  . MYALGIA 10/23/2006  . HYPERLIPIDEMIA 12/26/2005  . DISORDER, DYSTHYMIC 12/26/2005  . PAIN IN THORACIC SPINE 12/26/2005  . ANXIETY 12/23/2005  . Perennial allergic rhinitis 12/23/2005  . NECK PAIN 12/23/2005  . HEADACHE 12/23/2005   Kerin Perna, PTA 08/12/18 3:11 PM  [facility] [place], [place], [address] Phone: [phone]   Fax:  [phone]  Name: Adrienne Lara MRN: [id number] Date of Birth: [birth year]

## 2018-08-12 NOTE — Patient Instructions (Signed)

## 2018-08-17 ENCOUNTER — Other Ambulatory Visit: Payer: Self-pay

## 2018-08-17 ENCOUNTER — Encounter: Payer: Self-pay | Admitting: Physical Therapy

## 2018-08-17 ENCOUNTER — Ambulatory Visit (INDEPENDENT_AMBULATORY_CARE_PROVIDER_SITE_OTHER): Payer: 59 | Admitting: Physical Therapy

## 2018-08-17 DIAGNOSIS — M25552 Pain in left hip: Secondary | ICD-10-CM

## 2018-08-17 DIAGNOSIS — R2689 Other abnormalities of gait and mobility: Secondary | ICD-10-CM | POA: Diagnosis not present

## 2018-08-17 DIAGNOSIS — M6281 Muscle weakness (generalized): Secondary | ICD-10-CM

## 2018-08-17 NOTE — Therapy (Signed)
[place], [place], [address] Phone: [phone]   Fax:  [phone]  Physical Therapy Treatment  Patient Details  Name: Adrienne Lara MRN: [id number] Date of Birth: 01-Dec-1965 Referring Provider (PT): Dr. Lynne Leader   Encounter Date: 08/17/2018  PT End of Session - 08/17/18 1458    Visit Number  3    Number of Visits  12    Date for PT Re-Evaluation  09/21/18    Authorization Type  UHC    PT Start Time  1751    PT Stop Time  1450    PT Time Calculation (min)  47 min    Activity Tolerance  Patient tolerated treatment well    Behavior During Therapy  [facility] for tasks assessed/performed       Past Medical History:  Diagnosis Date  . Allergy   . Anxiety   . Asthma   . Heart murmur     Past Surgical History:  Procedure Laterality Date  . APPENDECTOMY    . BREAST BIOPSY Right   . INTRAUTERINE DEVICE (IUD) INSERTION    . REDUCTION MAMMAPLASTY      There were no vitals filed for this visit.  Subjective Assessment - 08/17/18 1407    Subjective  Pt reports she was able to walk 1.68miles prior to pain increasing to 4/10.  She states she felt worse the day after last session, but the following day much better.  She had a tiny bit of irritation from ionto patch, but not much; felt it was beneficial.    Patient Stated Goals  Decrease pain, return to running. would love to be able to run 5k    Currently in Pain?  No/denies    Pain Score  0-No pain         [facility] PT Assessment - 08/17/18 0001      Assessment   Medical Diagnosis  L hip Pain    Referring Provider (PT)  Dr. Lynne Leader    Next MD Visit  after PT    Prior Therapy  Yes/2 years ago        [facility] Adult PT Treatment/Exercise - 08/17/18 0001      Knee/Hip Exercises: Stretches   Passive Hamstring Stretch  Left;2 reps;20 seconds   supine with strap; bent knee/straight knee   Hip Flexor Stretch  Left;Right;2 reps;30 seconds    seated   ITB Stretch  Left;2 reps   standing   Piriformis Stretch  Left;20 seconds;3 reps    Piriformis Stretch Limitations  supine      Knee/Hip Exercises: Aerobic   Nustep  L5: arms/legs 5 min      Knee/Hip Exercises: Standing   SLS  SLS forward leans to touch seat of chair x 5 each side, then 2 more after mini-squats.     Other Standing Knee Exercises  mini squat with opp leg lateral slide out/in x 5 each side; mini squat with curtsy slide with opp leg x 5 each side       Knee/Hip Exercises: Sidelying   Hip ABduction Limitations  2 reps after clams; LE fatigued/pain - switched to standing exercises.     Clams  x20 Lt with core engaged; repeated for reverse clams x 20 with 2# at ankle      Iontophoresis   Type of Iontophoresis  Dexamethasone    Location  Lt greater trochanter     Dose  1.0 cc     Time  120 mA stat patch, 12 hr       Manual Therapy   Manual therapy comments  pt in Rt sidelying;  IASTM and STM to Lt ant/lateral/ posterior hip to decrease fascial restrictions and pain.                PT Short Term Goals - 08/10/18 1448      PT SHORT TERM GOAL #1   Title  Pt to be independent with initial HEP    Time  2    Period  Weeks    Status  New    Target Date  08/24/18      PT SHORT TERM GOAL #2   Title  Pt to report decreased pain in L hip, to 4/10 with activity    Time  2    Period  Weeks    Status  New    Target Date  08/24/18        PT Long Term Goals - 08/10/18 1448      PT LONG TERM GOAL #1   Title  Pt to be independent with final HEP for strength and stabilization of L hip and core    Time  6    Period  Weeks    Status  New    Target Date  09/21/18      PT LONG TERM GOAL #2   Title  Pt to demo increased strength of L hip, to be at least 4+/5 to improve stability and pain.    Time  6    Period  Weeks    Status  New    Target Date  09/21/18      PT LONG TERM GOAL #3   Title  Pt to report decreased pain to 0-2/10 in L hip,  with  standing activity.    Time  6    Period  Weeks    Status  New    Target Date  09/21/18      PT LONG TERM GOAL #4   Title  Pt to demo ability for SLS with dynamic movement, with stability WNL/minimal sway, and pain 0-2/10    Time  6    Period  Weeks    Status  New    Target Date  09/21/18            Plan - 08/17/18 1503    Clinical Impression Statement  Pt fatigues quickly with LLE chain strengthening in sidelying and standing.  Ionto patch was positive; repeated today.  Pt able to walk a little further/ longer prior to pain increasing.  Pt progressing towards goals.    Rehab Potential  Good    PT Frequency  2x / week    PT Duration  6 weeks    PT Treatment/Interventions  ADLs/Self Care Home Management;Cryotherapy;Electrical Stimulation;DME Instruction;Ultrasound;Traction;Moist Heat;Iontophoresis 4mg /ml Dexamethasone;Gait training;Stair training;Functional mobility training;Therapeutic activities;Therapeutic exercise;Balance training;Orthotic Fit/Training;Patient/family education;Neuromuscular re-education;Manual techniques;Passive range of motion;Dry needling;Spinal Manipulations;Vasopneumatic Device;Taping;Joint Manipulations    PT Next Visit Plan  Manual /DN for L hip, (anterior/lateral);  Strength, stabilization, progression to standing position as able. Add forward leans and curtsy mini lunges to HEP.    PT Home Exercise Plan  [email or web address]    Consulted and Agree with Plan of Care  Patient       Patient will benefit from skilled therapeutic intervention in order to improve the following deficits and impairments:  Abnormal gait, Difficulty walking, Increased muscle spasms, Decreased endurance, Decreased activity tolerance, Pain, Decreased balance, Impaired  flexibility, Improper body mechanics, Decreased strength, Decreased mobility  Visit Diagnosis: 1. Pain in left hip   2. Muscle weakness (generalized)   3. Other abnormalities of gait and mobility        Problem  List Patient Active Problem List   Diagnosis Date Noted  . 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 Chronic left hip pain 07/23/2018  . Lipoma of left thigh 07/23/2018  . Reactive airway disease 01/23/2018  . Colon cancer screening 01/23/2018  . Encounter for long-term (current) use of medications 01/23/2018  . Overweight with body mass index (BMI) 25.0-29.9 09/23/2017  . Secondary amenorrhea 08/25/2017  . Breast cancer screening, high risk patient 08/25/2017  . Family history of breast cancer in mother 08/25/2017  . White coat syndrome without diagnosis of hypertension 08/25/2017  . Abnormal weight gain 08/25/2017  . Migraine with aura and without status migrainosus, not intractable 08/25/2017  . Anxiety disorder 08/25/2017  . IUD (intrauterine device) in place 08/16/2015  . Mild intermittent asthma, uncomplicated 64/68/0321  . [place] DISEASE, CERVICAL 12/30/2006  . NEVUS, ATYPICAL 11/10/2006  . SINUSITIS, ACUTE NOS 10/23/2006  . EXCESSIVE MENSTRUATION 10/23/2006  . MYALGIA 10/23/2006  . HYPERLIPIDEMIA 12/26/2005  . DISORDER, DYSTHYMIC 12/26/2005  . PAIN IN THORACIC SPINE 12/26/2005  . ANXIETY 12/23/2005  . Perennial allergic rhinitis 12/23/2005  . NECK PAIN 12/23/2005  . HEADACHE 12/23/2005   Kerin Perna, PTA 08/17/18 3:09 PM  [place], [place], [address] Phone: [phone]   Fax:  [phone]  Name: Adrienne Lara MRN: [id number] Date of Birth: [birth year]

## 2018-08-18 ENCOUNTER — Other Ambulatory Visit: Payer: Self-pay

## 2018-08-18 DIAGNOSIS — F419 Anxiety disorder, unspecified: Secondary | ICD-10-CM

## 2018-08-18 DIAGNOSIS — R635 Abnormal weight gain: Secondary | ICD-10-CM

## 2018-08-18 DIAGNOSIS — G43109 Migraine with aura, not intractable, without status migrainosus: Secondary | ICD-10-CM

## 2018-08-18 MED ORDER — TOPIRAMATE 25 MG PO TABS: 75.0000 mg | ORAL_TABLET | Freq: Two times a day (BID) | ORAL | 0 refills | Status: DC

## 2018-08-18 MED ORDER — DULOXETINE HCL 20 MG PO CPEP: 20.0000 mg | ORAL_CAPSULE | Freq: Two times a day (BID) | ORAL | 0 refills | Status: DC

## 2018-08-19 ENCOUNTER — Encounter: Payer: Self-pay | Admitting: Rehabilitative and Restorative Service Providers"

## 2018-08-19 ENCOUNTER — Ambulatory Visit (INDEPENDENT_AMBULATORY_CARE_PROVIDER_SITE_OTHER): Payer: 59 | Admitting: Rehabilitative and Restorative Service Providers"

## 2018-08-19 ENCOUNTER — Other Ambulatory Visit: Payer: Self-pay

## 2018-08-19 DIAGNOSIS — M25552 Pain in left hip: Secondary | ICD-10-CM

## 2018-08-19 DIAGNOSIS — M6281 Muscle weakness (generalized): Secondary | ICD-10-CM

## 2018-08-19 DIAGNOSIS — R2689 Other abnormalities of gait and mobility: Secondary | ICD-10-CM | POA: Diagnosis not present

## 2018-08-19 DIAGNOSIS — M5442 Lumbago with sciatica, left side: Secondary | ICD-10-CM

## 2018-08-19 DIAGNOSIS — R293 Abnormal posture: Secondary | ICD-10-CM | POA: Diagnosis not present

## 2018-08-19 DIAGNOSIS — G8929 Other chronic pain: Secondary | ICD-10-CM

## 2018-08-19 NOTE — Therapy (Signed)
[place], [place], [address] Phone: [phone]   Fax:  [phone]  Physical Therapy Treatment  Patient Details  Name: Adrienne Lara MRN: [id number] Date of Birth: Oct 19, 1965 Referring Provider (PT): Dr. Lynne Leader   Encounter Date: 08/19/2018  PT End of Session - 08/19/18 1409    Visit Number  4    Number of Visits  12    Date for PT Re-Evaluation  09/21/18    PT Start Time  6433    PT Stop Time  2951    PT Time Calculation (min)  46 min    Activity Tolerance  Patient tolerated treatment well       Past Medical History:  Diagnosis Date  . Allergy   . Anxiety   . Asthma   . Heart murmur     Past Surgical History:  Procedure Laterality Date  . APPENDECTOMY    . BREAST BIOPSY Right   . INTRAUTERINE DEVICE (IUD) INSERTION    . REDUCTION MAMMAPLASTY      There were no vitals filed for this visit.  Subjective Assessment - 08/19/18 1409    Subjective  Patient reports that she did well with the ionto patch and she has used ice a few times which has helped. Pain with getting in and out of her car and going up and down steps. Walking ~ 35 min yesterday with some pain in the last 10 min - calmed down with shower and stretching.    Currently in Pain?  No/denies                       [facility] Adult PT Treatment/Exercise - 08/19/18 0001      Therapeutic Activites    Therapeutic Activities  --   myofacial ball release work anterior hips pt in prone      Knee/Hip Exercises: Stretches   Passive Hamstring Stretch  Left;2 reps;20 seconds   supine with strap; knee straight ankle in inversion    Hip Flexor Stretch  Left;Right;2 reps;30 seconds   seated   Hip Flexor Stretch Limitations  added hip flexor supine Marcello Moores    PT assist for stretch    Piriformis Stretch  Right;Left;2 reps;30 seconds   supine travell - tolerated afer DN hip adductors      Knee/Hip Exercises:  Aerobic   Nustep  L5: arms/legs 5 min      Manual Therapy   Manual therapy comments  pt supine LE's supported on bolster     Soft tissue mobilization  deep tissue work Lt hip adductors/hip flexors; ITB to lateral calf     Myofascial Release  hip adductors     Passive ROM  stretch for hip flexors and adductors in supine              PT Education - 08/19/18 1506    Education Details  HEP DN    Person(s) Educated  Patient    Methods  Explanation;Demonstration;Tactile cues;Verbal cues;Handout    Comprehension  Verbalized understanding;Returned demonstration;Verbal cues required;Tactile cues required       PT Short Term Goals - 08/10/18 1448      PT SHORT TERM GOAL #1   Title  Pt to be independent with initial HEP    Time  2    Period  Weeks    Status  New    Target Date  08/24/18      PT SHORT TERM GOAL #  2   Title  Pt to report decreased pain in L hip, to 4/10 with activity    Time  2    Period  Weeks    Status  New    Target Date  08/24/18        PT Long Term Goals - 08/10/18 1448      PT LONG TERM GOAL #1   Title  Pt to be independent with final HEP for strength and stabilization of L hip and core    Time  6    Period  Weeks    Status  New    Target Date  09/21/18      PT LONG TERM GOAL #2   Title  Pt to demo increased strength of L hip, to be at least 4+/5 to improve stability and pain.    Time  6    Period  Weeks    Status  New    Target Date  09/21/18      PT LONG TERM GOAL #3   Title  Pt to report decreased pain to 0-2/10 in L hip,  with standing activity.    Time  6    Period  Weeks    Status  New    Target Date  09/21/18      PT LONG TERM GOAL #4   Title  Pt to demo ability for SLS with dynamic movement, with stability WNL/minimal sway, and pain 0-2/10    Time  6    Period  Weeks    Status  New    Target Date  09/21/18            Plan - 08/19/18 1409    Clinical Impression Statement  Improving tolerance for functional activities.  Note significant muscular tightness Lt hip adductors, hip flexors; ITB and fibularius longus/brevis. Good response to DN and manual work followed by stretching. Patient questioning some problems with Lt ankle that may contributre to Lt LE/hip/LB problems. Will benefit from continued PT to evaluate and treat muscular tightness and imbalance.    Rehab Potential  Good    PT Frequency  2x / week    PT Duration  6 weeks    PT Treatment/Interventions  ADLs/Self Care Home Management;Cryotherapy;Electrical Stimulation;DME Instruction;Ultrasound;Traction;Moist Heat;Iontophoresis 4mg /ml Dexamethasone;Gait training;Stair training;Functional mobility training;Therapeutic activities;Therapeutic exercise;Balance training;Orthotic Fit/Training;Patient/family education;Neuromuscular re-education;Manual techniques;Passive range of motion;Dry needling;Spinal Manipulations;Vasopneumatic Device;Taping;Joint Manipulations    PT Next Visit Plan  Manual /DN for L hip, (anterior/lateral);  Strength, stabilization, progression to standing position as able. assess response to DN and manual work.    PT Home Exercise Plan  [email or web address]    Consulted and Agree with Plan of Care  Patient       Patient will benefit from skilled therapeutic intervention in order to improve the following deficits and impairments:  Abnormal gait, Difficulty walking, Increased muscle spasms, Decreased endurance, Decreased activity tolerance, Pain, Decreased balance, Impaired flexibility, Improper body mechanics, Decreased strength, Decreased mobility  Visit Diagnosis: 1. Pain in left hip   2. Muscle weakness (generalized)   3. Other abnormalities of gait and mobility   4. Abnormal posture   5. Chronic left-sided low back pain with left-sided sciatica        Problem List Patient Active Problem List   Diagnosis Date Noted  . 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 Chronic left hip pain 07/23/2018  . Lipoma of left  thigh 07/23/2018  . Reactive airway disease 01/23/2018  .  Colon cancer screening 01/23/2018  . Encounter for long-term (current) use of medications 01/23/2018  . Overweight with body mass index (BMI) 25.0-29.9 09/23/2017  . Secondary amenorrhea 08/25/2017  . Breast cancer screening, high risk patient 08/25/2017  . Family history of breast cancer in mother 08/25/2017  . White coat syndrome without diagnosis of hypertension 08/25/2017  . Abnormal weight gain 08/25/2017  . Migraine with aura and without status migrainosus, not intractable 08/25/2017  . Anxiety disorder 08/25/2017  . IUD (intrauterine device) in place 08/16/2015  . Mild intermittent asthma, uncomplicated 83/72/9021  . [place] DISEASE, CERVICAL 12/30/2006  . NEVUS, ATYPICAL 11/10/2006  . SINUSITIS, ACUTE NOS 10/23/2006  . EXCESSIVE MENSTRUATION 10/23/2006  . MYALGIA 10/23/2006  . HYPERLIPIDEMIA 12/26/2005  . DISORDER, DYSTHYMIC 12/26/2005  . PAIN IN THORACIC SPINE 12/26/2005  . ANXIETY 12/23/2005  . Perennial allergic rhinitis 12/23/2005  . NECK PAIN 12/23/2005  . HEADACHE 12/23/2005    Celyn Nilda Simmer PT, MPH  08/19/2018, 3:16 PM  [facility] [place], [place], [address] Phone: [phone]   Fax:  [phone]  Name: Adrienne Lara MRN: [id number] Date of Birth: Jun 01, 1965

## 2018-08-19 NOTE — Patient Instructions (Signed)
Access Code: [email or web address]  URL: [email or web address]  Date: 08/19/2018  Prepared by: Gillermo Murdoch   Exercises  Supine Bridge - 10 reps - 2 sets - 2x daily  Sidelying Hip Abduction - 10 reps - 2 sets - 2x daily  Clamshell - 10 reps - 2 sets - 2x daily  Supine Piriformis Stretch - 3 reps - 30 hold - 2x daily  Supine Piriformis Stretch with Leg Straight - 3 reps - 30 hold - 2x daily  Half Kneeling Hip Flexor Stretch - 3 reps - 30 hold - 2x daily  Hip Flexor Stretch at Edge of Bed - 3 reps - 1 sets - 30 sec hold - 2x daily - 7x weekly   Trigger Point Dry Needling  . What is Trigger Point Dry Needling (DN)? o DN is a physical therapy technique used to treat muscle pain and dysfunction. Specifically, DN helps deactivate muscle trigger points (muscle knots).  o A thin filiform needle is used to penetrate the skin and stimulate the underlying trigger point. The goal is for a local twitch response (LTR) to occur and for the trigger point to relax. No medication of any kind is injected during the procedure.   . What Does Trigger Point Dry Needling Feel Like?  o The procedure feels different for each individual patient. Some patients report that they do not actually feel the needle enter the skin and overall the process is not painful. Very mild bleeding may occur. However, many patients feel a deep cramping in the muscle in which the needle was inserted. This is the local twitch response.   [name] How Will I feel after the treatment? o Soreness is normal, and the onset of soreness may not occur for a few hours. Typically this soreness does not last longer than two days.  o Bruising is uncommon, however; ice can be used to decrease any possible bruising.  o In rare cases feeling tired or nauseous after the treatment is normal. In addition, your symptoms may get worse before they get better, this period will typically not last longer than 24 hours.   . What Can I do After My  Treatment? o Increase your hydration by drinking more water for the next 24 hours. o You may place ice or heat on the areas treated that have become sore, however, do not use heat on inflamed or bruised areas. Heat often brings more relief post needling. o You can continue your regular activities, but vigorous activity is not recommended initially after the treatment for 24 hours. o DN is best combined with other physical therapy such as strengthening, stretching, and other therapies.

## 2018-08-20 ENCOUNTER — Encounter: Payer: Self-pay | Admitting: Physician Assistant

## 2018-08-24 ENCOUNTER — Encounter: Payer: 59 | Admitting: Physical Therapy

## 2018-08-31 ENCOUNTER — Other Ambulatory Visit: Payer: Self-pay

## 2018-08-31 ENCOUNTER — Ambulatory Visit (INDEPENDENT_AMBULATORY_CARE_PROVIDER_SITE_OTHER): Payer: 59 | Admitting: Physical Therapy

## 2018-08-31 DIAGNOSIS — R2689 Other abnormalities of gait and mobility: Secondary | ICD-10-CM | POA: Diagnosis not present

## 2018-08-31 DIAGNOSIS — M6281 Muscle weakness (generalized): Secondary | ICD-10-CM | POA: Diagnosis not present

## 2018-08-31 DIAGNOSIS — R293 Abnormal posture: Secondary | ICD-10-CM | POA: Diagnosis not present

## 2018-08-31 DIAGNOSIS — M25552 Pain in left hip: Secondary | ICD-10-CM

## 2018-08-31 NOTE — Therapy (Signed)
[place], [place], [address] Phone: [phone]   Fax:  [phone]  Physical Therapy Treatment  Patient Details  Name: Adrienne Lara MRN: [id number] Date of Birth: 1965/11/05 Referring Provider (PT): Dr. Lynne Leader   Encounter Date: 08/31/2018  PT End of Session - 08/31/18 1511    Visit Number  5    Number of Visits  12    Date for PT Re-Evaluation  09/21/18    Authorization Type  UHC    PT Start Time  2694    PT Stop Time  1510    PT Time Calculation (min)  38 min    Activity Tolerance  Patient tolerated treatment well;No increased pain    Behavior During Therapy  WFL for tasks assessed/performed       Past Medical History:  Diagnosis Date  . Allergy   . Anxiety   . Asthma   . Heart murmur     Past Surgical History:  Procedure Laterality Date  . APPENDECTOMY    . BREAST BIOPSY Right   . INTRAUTERINE DEVICE (IUD) INSERTION    . REDUCTION MAMMAPLASTY      There were no vitals filed for this visit.  Subjective Assessment - 08/31/18 1444    Subjective  Pt reports she is able to walk longer before she has any twinges.  First thing in morning, she is stiff.  She is anxious to begin running.    Currently in Pain?  No/denies    Pain Score  0-No pain         [facility] PT Assessment - 08/31/18 0001      Assessment   Medical Diagnosis  L hip Pain    Referring Provider (PT)  Dr. Lynne Leader    Next MD Visit  after PT    Prior Therapy  Yes/2 years ago       [facility] Adult PT Treatment/Exercise - 08/31/18 0001      Knee/Hip Exercises: Stretches   Hip Flexor Stretch  Left;Right;2 reps;30 seconds   seated   Piriformis Stretch  Left;2 reps , 2 sets   Piriformis Stretch Limitations  also criss cross legs in supine holding ankles    Other Knee/Hip Stretches  standing hip adductor stretch x 15 sec each side.        Knee/Hip Exercises: Aerobic   Tread Mill  jog/walk trial:  2.7 mph to  3.8 mph, 45 sec -1 min jog, 1 min walk up to 6 min     Nustep  L5: arms/legs 5 min      Knee/Hip Exercises: Sidelying   Hip ABduction  1 set;10 reps;Left    Hip ABduction Limitations  then 5 rainbow arcs wiht LLE, then Lt leg circles with hip in abdct x 10    Clams  LLE x 10, reverse clam x 10      Iontophoresis   Type of Iontophoresis  Dexamethasone    Location  Lt greater trochanter     Dose  1.0 cc     Time  120 mA stat patch, 12 hr                PT Short Term Goals - 08/10/18 1448      PT SHORT TERM GOAL #1   Title  Pt to be independent with initial HEP    Time  2    Period  Weeks    Status  New    Target  Date  08/24/18      PT SHORT TERM GOAL #2   Title  Pt to report decreased pain in L hip, to 4/10 with activity    Time  2    Period  Weeks    Status  New    Target Date  08/24/18        PT Long Term Goals - 08/10/18 1448      PT LONG TERM GOAL #1   Title  Pt to be independent with final HEP for strength and stabilization of L hip and core    Time  6    Period  Weeks    Status  New    Target Date  09/21/18      PT LONG TERM GOAL #2   Title  Pt to demo increased strength of L hip, to be at least 4+/5 to improve stability and pain.    Time  6    Period  Weeks    Status  New    Target Date  09/21/18      PT LONG TERM GOAL #3   Title  Pt to report decreased pain to 0-2/10 in L hip,  with standing activity.    Time  6    Period  Weeks    Status  New    Target Date  09/21/18      PT LONG TERM GOAL #4   Title  Pt to demo ability for SLS with dynamic movement, with stability WNL/minimal sway, and pain 0-2/10    Time  6    Period  Weeks    Status  New    Target Date  09/21/18            Plan - 08/31/18 1511    Clinical Impression Statement  Pt had positive response with DN to adductors in previous session. Pt reporting ability to walk longer without pain.  Trial of jogging today (walk: jog 53mn/1min) - pt reported minor "twinges" when  nearing 1 min of jogging. Twinges resolved with walking.  Pt continues to fatigue quickly with hip abdct exercises.  Pt has met STG #1 and 2.    Rehab Potential  Good    PT Frequency  2x / week    PT Duration  6 weeks    PT Treatment/Interventions  ADLs/Self Care Home Management;Cryotherapy;Electrical Stimulation;DME Instruction;Ultrasound;Traction;Moist Heat;Iontophoresis 488mml Dexamethasone;Gait training;Stair training;Functional mobility training;Therapeutic activities;Therapeutic exercise;Balance training;Orthotic Fit/Training;Patient/family education;Neuromuscular re-education;Manual techniques;Passive range of motion;Dry needling;Spinal Manipulations;Vasopneumatic Device;Taping;Joint Manipulations    PT Next Visit Plan  Manual /DN for L hip, (adductors/lateral hip);  Strength, stabilization, progression to standing position as able.  assess SLS exercises, per goal.    PT Home Exercise Plan  [id number]    Consulted and Agree with Plan of Care  Patient       Patient will benefit from skilled therapeutic intervention in order to improve the following deficits and impairments:  Abnormal gait, Difficulty walking, Increased muscle spasms, Decreased endurance, Decreased activity tolerance, Pain, Decreased balance, Impaired flexibility, Improper body mechanics, Decreased strength, Decreased mobility  Visit Diagnosis: 1. Pain in left hip   2. Muscle weakness (generalized)   3. Other abnormalities of gait and mobility   4. Abnormal posture        Problem List Patient Active Problem List   Diagnosis Date Noted  . 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 Chronic left hip pain 07/23/2018  . Lipoma of left thigh 07/23/2018  . Reactive  airway disease 01/23/2018  . Colon cancer screening 01/23/2018  . Encounter for long-term (current) use of medications 01/23/2018  . Overweight with body mass index (BMI) 25.0-29.9 09/23/2017  . Secondary amenorrhea 08/25/2017  .  Breast cancer screening, high risk patient 08/25/2017  . Family history of breast cancer in mother 08/25/2017  . White coat syndrome without diagnosis of hypertension 08/25/2017  . Abnormal weight gain 08/25/2017  . Migraine with aura and without status migrainosus, not intractable 08/25/2017  . Anxiety disorder 08/25/2017  . IUD (intrauterine device) in place 08/16/2015  . Mild intermittent asthma, uncomplicated 45/03/8880  . [place] DISEASE, CERVICAL 12/30/2006  . NEVUS, ATYPICAL 11/10/2006  . SINUSITIS, ACUTE NOS 10/23/2006  . EXCESSIVE MENSTRUATION 10/23/2006  . MYALGIA 10/23/2006  . HYPERLIPIDEMIA 12/26/2005  . DISORDER, DYSTHYMIC 12/26/2005  . PAIN IN THORACIC SPINE 12/26/2005  . ANXIETY 12/23/2005  . Perennial allergic rhinitis 12/23/2005  . NECK PAIN 12/23/2005  . HEADACHE 12/23/2005    Kerin Perna, PTA 08/31/18 5:15 PM  [place], [place], [address] Phone: [phone]   Fax:  [phone]  Name: Adrienne Lara MRN: [id number] Date of Birth: March 21, 1965

## 2018-09-02 ENCOUNTER — Encounter: Payer: Self-pay | Admitting: Physical Therapy

## 2018-09-02 ENCOUNTER — Other Ambulatory Visit: Payer: Self-pay

## 2018-09-02 ENCOUNTER — Ambulatory Visit (INDEPENDENT_AMBULATORY_CARE_PROVIDER_SITE_OTHER): Payer: 59 | Admitting: Physical Therapy

## 2018-09-02 DIAGNOSIS — R2689 Other abnormalities of gait and mobility: Secondary | ICD-10-CM | POA: Diagnosis not present

## 2018-09-02 DIAGNOSIS — R293 Abnormal posture: Secondary | ICD-10-CM | POA: Diagnosis not present

## 2018-09-02 DIAGNOSIS — M6281 Muscle weakness (generalized): Secondary | ICD-10-CM | POA: Diagnosis not present

## 2018-09-02 DIAGNOSIS — M5442 Lumbago with sciatica, left side: Secondary | ICD-10-CM

## 2018-09-02 DIAGNOSIS — G8929 Other chronic pain: Secondary | ICD-10-CM

## 2018-09-02 DIAGNOSIS — M25552 Pain in left hip: Secondary | ICD-10-CM | POA: Diagnosis not present

## 2018-09-02 NOTE — Therapy (Signed)
[place], [place], [address] Phone: [phone]   Fax:  [phone]  Physical Therapy Treatment  Patient Details  Name: Adrienne Lara MRN: [id number] Date of Birth: Mar 16, 1965 Referring Provider (PT): Dr. Lynne Leader   Encounter Date: 09/02/2018  PT End of Session - 09/02/18 1543    Visit Number  6    Number of Visits  12    Date for PT Re-Evaluation  09/21/18    Authorization Type  UHC    PT Start Time  1440    PT Stop Time  7793    PT Time Calculation (min)  39 min    Activity Tolerance  Patient tolerated treatment well;No increased pain    Behavior During Therapy  WFL for tasks assessed/performed       Past Medical History:  Diagnosis Date  . Allergy   . Anxiety   . Asthma   . Heart murmur     Past Surgical History:  Procedure Laterality Date  . APPENDECTOMY    . BREAST BIOPSY Right   . INTRAUTERINE DEVICE (IUD) INSERTION    . REDUCTION MAMMAPLASTY      There were no vitals filed for this visit.  Subjective Assessment - 09/02/18 1444    Subjective  hip is doing well today.  just did exercises yesterday but overall doing really well.    Patient Stated Goals  Decrease pain, return to running. would love to be able to run 5k    Currently in Pain?  No/denies                       [facility] Adult PT Treatment/Exercise - 09/02/18 1445      Knee/Hip Exercises: Aerobic   Elliptical  L1 x 5 min      Manual Therapy   Manual Therapy  Soft tissue mobilization    Manual therapy comments  pt supine LE's supported on bolster and Rt sidelying    Soft tissue mobilization  deep tissue work Lt hip adductors/hip flexors; Lt ITB and piriformis    Passive ROM  stretch for hip flexors and adductors in supine        Trigger Point Dry Needling - 09/02/18 1542    Consent Given?  Yes    Education Handout Provided  Previously provided    Muscles Treated Lower Quadrant  Adductor  longus/brevis/magnus    Muscles Treated Back/Hip  Piriformis;Tensor fascia lata    Electrical Stimulation Performed with Dry Needling  Yes    E-stim with Dry Needling Details  Lt adductors 10 mHz freq to tolerance    Adductor Response  Twitch response elicited;Palpable increased muscle length    Piriformis Response  Twitch response elicited;Palpable increased muscle length    Tensor Fascia Lata Response  Twitch response elicited;Palpable increased muscle length             PT Short Term Goals - 09/02/18 1543      PT SHORT TERM GOAL #1   Title  Pt to be independent with initial HEP    Time  2    Period  Weeks    Status  Achieved   per 8/10 note   Target Date  08/24/18      PT SHORT TERM GOAL #2   Title  Pt to report decreased pain in L hip, to 4/10 with activity    Time  2    Period  Weeks  Status  Achieved   per 8/10 note   Target Date  08/24/18        PT Long Term Goals - 08/10/18 1448      PT LONG TERM GOAL #1   Title  Pt to be independent with final HEP for strength and stabilization of L hip and core    Time  6    Period  Weeks    Status  New    Target Date  09/21/18      PT LONG TERM GOAL #2   Title  Pt to demo increased strength of L hip, to be at least 4+/5 to improve stability and pain.    Time  6    Period  Weeks    Status  New    Target Date  09/21/18      PT LONG TERM GOAL #3   Title  Pt to report decreased pain to 0-2/10 in L hip,  with standing activity.    Time  6    Period  Weeks    Status  New    Target Date  09/21/18      PT LONG TERM GOAL #4   Title  Pt to demo ability for SLS with dynamic movement, with stability WNL/minimal sway, and pain 0-2/10    Time  6    Period  Weeks    Status  New    Target Date  09/21/18            Plan - 09/02/18 1544    Clinical Impression Statement  Pt tolerated session well today with positive response to DN and decreased point tenderness following session.  Overall progressing well with PT  with decreased pain.  Discussed trial of walk/jog this weekend if she continues to be pain free - pt may elect to hold until next week.    Rehab Potential  Good    PT Frequency  2x / week    PT Duration  6 weeks    PT Treatment/Interventions  ADLs/Self Care Home Management;Cryotherapy;Electrical Stimulation;DME Instruction;Ultrasound;Traction;Moist Heat;Iontophoresis 4mg /ml Dexamethasone;Gait training;Stair training;Functional mobility training;Therapeutic activities;Therapeutic exercise;Balance training;Orthotic Fit/Training;Patient/family education;Neuromuscular re-education;Manual techniques;Passive range of motion;Dry needling;Spinal Manipulations;Vasopneumatic Device;Taping;Joint Manipulations    PT Next Visit Plan  Manual /DN for L hip, (adductors/lateral hip);  Strength, stabilization, progression to standing position as able.  assess SLS exercises, per goal.    PT Home Exercise Plan  [id number]    Consulted and Agree with Plan of Care  Patient       Patient will benefit from skilled therapeutic intervention in order to improve the following deficits and impairments:  Abnormal gait, Difficulty walking, Increased muscle spasms, Decreased endurance, Decreased activity tolerance, Pain, Decreased balance, Impaired flexibility, Improper body mechanics, Decreased strength, Decreased mobility  Visit Diagnosis: 1. Pain in left hip   2. Muscle weakness (generalized)   3. Other abnormalities of gait and mobility   4. Abnormal posture   5. Chronic left-sided low back pain with left-sided sciatica        Problem List Patient Active Problem List   Diagnosis Date Noted  . 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 Chronic left hip pain 07/23/2018  . Lipoma of left thigh 07/23/2018  . Reactive airway disease 01/23/2018  . Colon cancer screening 01/23/2018  . Encounter for long-term (current) use of medications 01/23/2018  . Overweight with body mass index (BMI)  25.0-29.9 09/23/2017  . Secondary amenorrhea 08/25/2017  . Breast cancer screening, high  risk patient 08/25/2017  . Family history of breast cancer in mother 08/25/2017  . White coat syndrome without diagnosis of hypertension 08/25/2017  . Abnormal weight gain 08/25/2017  . Migraine with aura and without status migrainosus, not intractable 08/25/2017  . Anxiety disorder 08/25/2017  . IUD (intrauterine device) in place 08/16/2015  . Mild intermittent asthma, uncomplicated 36/12/2447  . [place] DISEASE, CERVICAL 12/30/2006  . NEVUS, ATYPICAL 11/10/2006  . SINUSITIS, ACUTE NOS 10/23/2006  . EXCESSIVE MENSTRUATION 10/23/2006  . MYALGIA 10/23/2006  . HYPERLIPIDEMIA 12/26/2005  . DISORDER, DYSTHYMIC 12/26/2005  . PAIN IN THORACIC SPINE 12/26/2005  . ANXIETY 12/23/2005  . Perennial allergic rhinitis 12/23/2005  . NECK PAIN 12/23/2005  . HEADACHE 12/23/2005      Laureen Abrahams, PT, DPT 09/02/18 3:46 PM    [facility] [place], [place], [address] Phone: [phone]   Fax:  [phone]  Name: KENDALL ARNELL MRN: [id number] Date of Birth: 06/12/1965

## 2018-09-08 ENCOUNTER — Encounter: Payer: 59 | Admitting: Physical Therapy

## 2018-09-10 ENCOUNTER — Other Ambulatory Visit: Payer: Self-pay

## 2018-09-10 ENCOUNTER — Ambulatory Visit (INDEPENDENT_AMBULATORY_CARE_PROVIDER_SITE_OTHER): Payer: 59 | Admitting: Physical Therapy

## 2018-09-10 ENCOUNTER — Encounter: Payer: Self-pay | Admitting: Physical Therapy

## 2018-09-10 DIAGNOSIS — R293 Abnormal posture: Secondary | ICD-10-CM | POA: Diagnosis not present

## 2018-09-10 DIAGNOSIS — R2689 Other abnormalities of gait and mobility: Secondary | ICD-10-CM

## 2018-09-10 DIAGNOSIS — M6281 Muscle weakness (generalized): Secondary | ICD-10-CM

## 2018-09-10 DIAGNOSIS — M25552 Pain in left hip: Secondary | ICD-10-CM

## 2018-09-10 NOTE — Therapy (Addendum)
[place], [place], [address] Phone: [phone]   Fax:  [phone]  Physical Therapy Treatment  Patient Details  Name: Adrienne Lara MRN: [id number] Date of Birth: 1965-03-06 Referring Provider (PT): Dr. Lynne Leader   Encounter Date: 09/10/2018  PT End of Session - 09/10/18 1519    Visit Number  7    Number of Visits  12    Date for PT Re-Evaluation  09/21/18    PT Start Time  1937    PT Stop Time  9024    PT Time Calculation (min)  31 min    Activity Tolerance  Patient tolerated treatment well;No increased pain    Behavior During Therapy  WFL for tasks assessed/performed       Past Medical History:  Diagnosis Date  . Allergy   . Anxiety   . Asthma   . Heart murmur     Past Surgical History:  Procedure Laterality Date  . APPENDECTOMY    . BREAST BIOPSY Right   . INTRAUTERINE DEVICE (IUD) INSERTION    . REDUCTION MAMMAPLASTY      There were no vitals filed for this visit.  Subjective Assessment - 09/10/18 1519    Subjective  Pt reports she has been doing a walk/jog 2x/wk, doing 1.5 min with each.  Also walking 2.5 miles.  Has been pain free.    Patient Stated Goals  Decrease pain, return to running. would love to be able to run 5k    Currently in Pain?  No/denies    Pain Score  0-No pain         [facility] PT Assessment - 09/10/18 0001      Assessment   Medical Diagnosis  L hip Pain    Referring Provider (PT)  Dr. Lynne Leader    Next MD Visit  after PT    Prior Therapy  Yes/2 years ago      Strength   Strength Assessment Site  Hip    Right/Left Hip  Right;Left    Right Hip Flexion  5/5    Right Hip Extension  4+/5    Right Hip ABduction  --   5-/5   Right Hip ADduction  4+/5    Left Hip Flexion  --   5-/5   Left Hip Extension  4/5    Left Hip ABduction  --   5-/5   Left Hip ADduction  4/5       [facility] Adult PT Treatment/Exercise - 09/10/18 0001      Knee/Hip  Exercises: Stretches   ITB Stretch  Left;Right;2 reps   standing    Piriformis Stretch  Left;2 reps;10 seconds    Other Knee/Hip Stretches  modified triangle pose x 15 sec x 2 reps each side.       Knee/Hip Exercises: Aerobic   Elliptical  L2: 3 min       Knee/Hip Exercises: Standing   SLS  SLS on LLE with mini squat while Rt toe tap front, side, back, curtsey x 1 rep for HEP.       Knee/Hip Exercises: Seated   Other Seated Knee/Hip Exercises  seated on green theraball - posterior lean with alternating arms to 90 deg with core engaged x 10.       Knee/Hip Exercises: Supine   Bridges  5 reps    Single Leg Bridge  Right;Left;1 set;10 reps   fig 4, cues to slow speed  Knee/Hip Exercises: Sidelying   Hip ADduction  Strengthening;Right;Left;1 set;10 reps    Hip ADduction Limitations  fatigues quickly with LLE      Knee/Hip Exercises: Prone   Other Prone Exercises  knee tucks from plank on green ball x 5 reps               PT Short Term Goals - 09/02/18 1543      PT SHORT TERM GOAL #1   Title  Pt to be independent with initial HEP    Time  2    Period  Weeks    Status  Achieved   per 8/10 note   Target Date  08/24/18      PT SHORT TERM GOAL #2   Title  Pt to report decreased pain in L hip, to 4/10 with activity    Time  2    Period  Weeks    Status  Achieved   per 8/10 note   Target Date  08/24/18        PT Long Term Goals - 09/10/18 1526      PT LONG TERM GOAL #1   Title  Pt to be independent with final HEP for strength and stabilization of L hip and core    Time  6    Period  Weeks    Status  On-going      PT LONG TERM GOAL #2   Title  Pt to demo increased strength of L hip, to be at least 4+/5 to improve stability and pain.    Time  6    Period  Weeks    Status  Partially Met      PT LONG TERM GOAL #3   Title  Pt to report decreased pain to 0-2/10 in L hip,  with standing activity.    Time  6    Period  Weeks    Status  Achieved      PT  LONG TERM GOAL #4   Title  Pt to demo ability for SLS with dynamic movement, with stability WNL/minimal sway, and pain 0-2/10    Time  6    Period  Weeks    Status  Partially Met            Plan - 09/10/18 1526    Clinical Impression Statement  Pt demonstrated improved LE strength; continues with weakness in Lt hip add and ext - added exercises to HEP to address this.  Pt has partially met LTG#2 and met #3.  Pt tolerated all exercises well without pain, just fatigue.    Rehab Potential  Good    PT Frequency  2x / week    PT Duration  6 weeks    PT Treatment/Interventions  ADLs/Self Care Home Management;Cryotherapy;Electrical Stimulation;DME Instruction;Ultrasound;Traction;Moist Heat;Iontophoresis 57m/ml Dexamethasone;Gait training;Stair training;Functional mobility training;Therapeutic activities;Therapeutic exercise;Balance training;Orthotic Fit/Training;Patient/family education;Neuromuscular re-education;Manual techniques;Passive range of motion;Dry needling;Spinal Manipulations;Vasopneumatic Device;Taping;Joint Manipulations    PT Next Visit Plan  end of POC, adjust HEP as needed. Assess readiness to d/c vs additional visits.    PT Home Exercise Plan  [id number]    Consulted and Agree with Plan of Care  Patient       Patient will benefit from skilled therapeutic intervention in order to improve the following deficits and impairments:  Abnormal gait, Difficulty walking, Increased muscle spasms, Decreased endurance, Decreased activity tolerance, Pain, Decreased balance, Impaired flexibility, Improper body mechanics, Decreased strength, Decreased mobility  Visit Diagnosis: Pain in left hip  Muscle  weakness (generalized)  Other abnormalities of gait and mobility  Abnormal posture     Problem List Patient Active Problem List   Diagnosis Date Noted  . 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 Chronic left hip pain 07/23/2018  . Lipoma of left thigh  07/23/2018  . Reactive airway disease 01/23/2018  . Colon cancer screening 01/23/2018  . Encounter for long-term (current) use of medications 01/23/2018  . Overweight with body mass index (BMI) 25.0-29.9 09/23/2017  . Secondary amenorrhea 08/25/2017  . Breast cancer screening, high risk patient 08/25/2017  . Family history of breast cancer in mother 08/25/2017  . White coat syndrome without diagnosis of hypertension 08/25/2017  . Abnormal weight gain 08/25/2017  . Migraine with aura and without status migrainosus, not intractable 08/25/2017  . Anxiety disorder 08/25/2017  . IUD (intrauterine device) in place 08/16/2015  . Mild intermittent asthma, uncomplicated 18/98/4210  . [place] DISEASE, CERVICAL 12/30/2006  . NEVUS, ATYPICAL 11/10/2006  . SINUSITIS, ACUTE NOS 10/23/2006  . EXCESSIVE MENSTRUATION 10/23/2006  . MYALGIA 10/23/2006  . HYPERLIPIDEMIA 12/26/2005  . DISORDER, DYSTHYMIC 12/26/2005  . PAIN IN THORACIC SPINE 12/26/2005  . ANXIETY 12/23/2005  . Perennial allergic rhinitis 12/23/2005  . NECK PAIN 12/23/2005  . HEADACHE 12/23/2005   Kerin Perna, PTA 09/10/18 3:36 PM  [facility] [place], [place], [address] Phone: [phone]   Fax:  [phone]  Name: Adrienne Lara MRN: [id number] Date of Birth: Mar 12, 1965    PHYSICAL THERAPY DISCHARGE SUMMARY  Visits from Start of Care: 7   Plan: Patient agrees to discharge.  Patient goals were met. Patient is being discharged due to not returning since the last visit.  ?????     Pt did not return for last visit, but was doing very well at last visit seen, no pain.    Lyndee Hensen, PT, DPT 11:07 AM  10/21/18

## 2018-09-23 ENCOUNTER — Encounter: Payer: 59 | Admitting: Physical Therapy

## 2018-10-05 ENCOUNTER — Encounter: Payer: Self-pay | Admitting: Physician Assistant

## 2018-10-21 ENCOUNTER — Encounter: Payer: Self-pay | Admitting: Family Medicine

## 2018-10-21 ENCOUNTER — Telehealth (INDEPENDENT_AMBULATORY_CARE_PROVIDER_SITE_OTHER): Payer: 59 | Admitting: Family Medicine

## 2018-10-21 VITALS — BP 122/78 | Wt 162.0 lb

## 2018-10-21 DIAGNOSIS — J302 Other seasonal allergic rhinitis: Secondary | ICD-10-CM | POA: Diagnosis not present

## 2018-10-21 DIAGNOSIS — J0110 Acute frontal sinusitis, unspecified: Secondary | ICD-10-CM | POA: Diagnosis not present

## 2018-10-21 DIAGNOSIS — J3089 Other allergic rhinitis: Secondary | ICD-10-CM

## 2018-10-21 MED ORDER — AZITHROMYCIN 250 MG PO TABS: ORAL_TABLET | ORAL | 0 refills | Status: AC

## 2018-10-21 NOTE — Assessment & Plan Note (Signed)
We will treat with azithromycin.  We also had a discussion that some of her symptoms also overlap potentially with COVID and I would encourage her to also get tested with discussed the drive-by center in [place] as well as the local [facility].  She says she is been having significant fatigue and body aches.

## 2018-10-21 NOTE — Assessment & Plan Note (Signed)
Sounds like she has been using most of her over-the-counter treatments but does report that her allergies are more severe.  We discussed potential referral to allergy and immunology for possible consultation for immunotherapy or additional treatments.  Referral placed today to [facility]

## 2018-10-21 NOTE — Progress Notes (Signed)
Virtual Visit via Video Note  I connected with [name] on 10/21/18 at 11:30 AM EDT by a video enabled telemedicine application and verified that I am speaking with the correct person using two identifiers.   I discussed the limitations of evaluation and management by telemedicine and the availability of in person appointments. The patient expressed understanding and agreed to proceed.     Acute Office Visit  Subjective:    Patient ID: Adrienne Lara, female    DOB: 1965/03/25, 53 y.o.   MRN: [id number]  Chief Complaint  Patient presents with  . Allergies    HPI Patient is in today for new onset sxs. She has severe allergies and asthma and for the last week has been getting worse. She has been having some cold chills at night. She has been having severe HA and frontal and nasal facial pressure  Says putting hot clothe on her face helps.  She is also just felt extremely fatigued and sleepy.  She has had some body aches and watery eyes..  She has tried Singulair which she takes regularly, Claritin, Zyrtec and over-the-counter nasal decongestants.  She has some nausea yesterday but no vomiting she had 1 day of loose stools yesterday but nothing since then.  She denies any fever.  Past Medical History:  Diagnosis Date  . Allergy   . Anxiety   . Asthma   . Heart murmur     Past Surgical History:  Procedure Laterality Date  . APPENDECTOMY    . BREAST BIOPSY Right   . INTRAUTERINE DEVICE (IUD) INSERTION    . REDUCTION MAMMAPLASTY      Family History  Problem Relation Age of Onset  . Breast cancer Mother 62  . Obesity Mother   . Obesity Sister   . Heart attack Sister   . Alcohol abuse Sister   . Alcohol abuse Brother     Social History   Socioeconomic History  . Marital status: Married    Spouse name: Not on file  . Number of children: Not on file  . Years of education: Not on file  . Highest education level: Not on file  Occupational History  . Not  on file  Social Needs  . Financial resource strain: Not on file  . Food insecurity    Worry: Not on file    Inability: Not on file  . Transportation needs    Medical: Not on file    Non-medical: Not on file  Tobacco Use  . Smoking status: Former Research scientist (life sciences)  . Smokeless tobacco: Never Used  Substance and Sexual Activity  . Alcohol use: Yes    Alcohol/week: 7.0 standard drinks    Types: 7 Standard drinks or equivalent per week  . Drug use: Never  . Sexual activity: Not Currently    Birth control/protection: I.U.D.  Lifestyle  . Physical activity    Days per week: Not on file    Minutes per session: Not on file  . Stress: Not on file  Relationships  . Social Herbalist on phone: Not on file    Gets together: Not on file    Attends religious service: Not on file    Active member of club or organization: Not on file    Attends meetings of clubs or organizations: Not on file    Relationship status: Not on file  . Intimate partner violence    Fear of current or ex partner: Not on file  Emotionally abused: Not on file    Physically abused: Not on file    Forced sexual activity: Not on file  Other Topics Concern  . Not on file  Social History Narrative  . Not on file    Outpatient Medications Prior to Visit  Medication Sig Dispense Refill  . albuterol (PROVENTIL HFA;VENTOLIN HFA) 108 (90 Base) MCG/ACT inhaler Inhale 1-2 puffs into the lungs every 4 (four) hours as needed for wheezing or shortness of breath (bronchospasm). 1 Inhaler 2  . DULoxetine (CYMBALTA) 20 MG capsule Take 1 capsule (20 mg total) by mouth 2 (two) times daily. Due for follow up visit w/PCP (Patient taking differently: Take 20 mg by mouth daily. Due for follow up visit w/PCP) 60 capsule 0  . levonorgestrel (MIRENA, 52 MG,) 20 MCG/24HR IUD by Intrauterine route.    . metFORMIN (GLUCOPHAGE) 850 MG tablet Take 1 tablet (850 mg total) by mouth 3 (three) times daily with meals. 270 tablet 0  . montelukast  (SINGULAIR) 5 MG chewable tablet Chew 1 tablet (5 mg total) by mouth at bedtime. 90 tablet 3  . topiramate (TOPAMAX) 25 MG tablet Take 3 tablets (75 mg total) by mouth 2 (two) times daily. (Patient taking differently: Take 50 mg by mouth 2 (two) times daily. ) 180 tablet 0  . topiramate (TOPAMAX) 50 MG tablet Take 1 tablet by mouth 2 (two) times daily.     No facility-administered medications prior to visit.     Allergies  Allergen Reactions  . Penicillins Hives  . Sulfa Antibiotics Hives  . Sulfonamide Derivatives     ROS     Objective:    Physical Exam  Constitutional: She is oriented to person, place, and time. She appears well-developed and well-nourished.  HENT:  Head: Normocephalic and atraumatic.  Eyes: Conjunctivae and EOM are normal.  Cardiovascular: Normal rate.  Pulmonary/Chest: Effort normal.  Neurological: She is alert and oriented to person, place, and time.  Skin: Skin is dry. No pallor.  Psychiatric: She has a normal mood and affect. Her behavior is normal.  Vitals reviewed.   BP 122/78   Wt 162 lb (73.5 kg)   BMI 27.81 kg/m  Wt Readings from Last 3 Encounters:  10/21/18 162 lb (73.5 kg)  07/27/18 169 lb (76.7 kg)  07/23/18 168 lb (76.2 kg)    Health Maintenance Due  Topic Date Due  . COLONOSCOPY  07/14/2015    There are no preventive care reminders to display for this patient.   Lab Results  Component Value Date   TSH 0.746 10/23/2006   Lab Results  Component Value Date   WBC 4.4 07/27/2018   HGB 12.8 07/27/2018   HCT 37.5 07/27/2018   MCV 90.1 07/27/2018   PLT 262 07/27/2018   Lab Results  Component Value Date   NA 136 07/27/2018   K 3.8 07/27/2018   CO2 22 07/27/2018   GLUCOSE 95 07/27/2018   BUN 12 07/27/2018   CREATININE 0.82 07/27/2018   BILITOT 0.4 07/27/2018   AST 20 07/27/2018   ALT 14 07/27/2018   PROT 6.7 07/27/2018   CALCIUM 9.1 07/27/2018   Lab Results  Component Value Date   CHOL 186 07/27/2018   Lab Results   Component Value Date   HDL 68 07/27/2018   Lab Results  Component Value Date   LDLCALC 104 (H) 07/27/2018   Lab Results  Component Value Date   TRIG 55 07/27/2018   Lab Results  Component Value Date  CHOLHDL 2.7 07/27/2018   No results found for: HGBA1C     Assessment & Plan:   Problem List Items Addressed This Visit      Respiratory   SINUSITIS, ACUTE NOS    We will treat with azithromycin.  We also had a discussion that some of her symptoms also overlap potentially with COVID and I would encourage her to also get tested with discussed the drive-by center in [place] as well as the local [facility].  She says she is been having significant fatigue and body aches.      Relevant Medications   azithromycin (ZITHROMAX) 250 MG tablet   Perennial allergic rhinitis    Sounds like she has been using most of her over-the-counter treatments but does report that her allergies are more severe.  We discussed potential referral to allergy and immunology for possible consultation for immunotherapy or additional treatments.  Referral placed today to [facility]       Other Visit Diagnoses    Seasonal allergic rhinitis, unspecified trigger    -  Primary   Relevant Orders   Ambulatory referral to Allergy       Meds ordered this encounter  Medications  . azithromycin (ZITHROMAX) 250 MG tablet    Sig: 2 Ttabs PO on Day 1, then one a day x 4 days.    Dispense:  6 tablet    Refill:  0     I discussed the assessment and treatment plan with the patient. The patient was provided an opportunity to ask questions and all were answered. The patient agreed with the plan and demonstrated an understanding of the instructions.   The patient was advised to call back or seek an in-person evaluation if the symptoms worsen or if the condition fails to improve as anticipated.  Beatrice Lecher, MD

## 2018-10-21 NOTE — Progress Notes (Signed)
She reports this began about 6 days ago. She has been taking different allergy medications.   Body ache, post nasal drip, headache, malaise.Adrienne Lara, Lahoma Crocker, CMA

## 2018-11-26 ENCOUNTER — Telehealth: Payer: Self-pay

## 2018-11-26 ENCOUNTER — Other Ambulatory Visit: Payer: Self-pay | Admitting: Physician Assistant

## 2018-11-26 DIAGNOSIS — R635 Abnormal weight gain: Secondary | ICD-10-CM

## 2018-11-26 DIAGNOSIS — F419 Anxiety disorder, unspecified: Secondary | ICD-10-CM

## 2018-11-26 DIAGNOSIS — G43109 Migraine with aura, not intractable, without status migrainosus: Secondary | ICD-10-CM

## 2018-11-26 MED ORDER — TOPIRAMATE 50 MG PO TABS: 50.0000 mg | ORAL_TABLET | Freq: Two times a day (BID) | ORAL | 0 refills | Status: DC

## 2018-11-26 MED ORDER — DULOXETINE HCL 20 MG PO CPEP: 20.0000 mg | ORAL_CAPSULE | Freq: Two times a day (BID) | ORAL | 0 refills | Status: DC

## 2018-11-26 NOTE — Telephone Encounter (Signed)
Patient is going to call back to set up a follow up with Dr. Sheppard Coil. Spouse answered the phone and will have the patient call back.

## 2018-11-26 NOTE — Telephone Encounter (Signed)
90 days for both prescriptions were sent.  Patient should call to schedule an establish care visit with me within that time.

## 2018-11-26 NOTE — Telephone Encounter (Signed)
Patient called requesting RF on Topiramate and Duloxetine  Previous Adrienne Lara patient. Patient has been called and aware that she needs to establish care with new provider. States she will call to establish with [name] pended, pt completely out of medication

## 2018-12-15 ENCOUNTER — Ambulatory Visit (INDEPENDENT_AMBULATORY_CARE_PROVIDER_SITE_OTHER): Payer: 59 | Admitting: Physician Assistant

## 2018-12-15 ENCOUNTER — Encounter: Payer: Self-pay | Admitting: Physician Assistant

## 2018-12-15 VITALS — BP 128/67 | Ht 64.0 in | Wt 162.0 lb

## 2018-12-15 DIAGNOSIS — J3089 Other allergic rhinitis: Secondary | ICD-10-CM | POA: Diagnosis not present

## 2018-12-15 DIAGNOSIS — J4520 Mild intermittent asthma, uncomplicated: Secondary | ICD-10-CM | POA: Diagnosis not present

## 2018-12-15 NOTE — Progress Notes (Signed)
Patient ID: Adrienne Lara, female   DOB: [birth year], 53 y.o.   MRN: [id number] .Marland KitchenVirtual Visit via Telephone Note  I connected with Adrienne Lara on 12/15/18 at  2:20 PM EST by telephone and verified that I am speaking with the correct person using two identifiers.  Location: Patient: home Provider: clinic   I discussed the limitations, risks, security and privacy concerns of performing an evaluation and management service by telephone and the availability of in person appointments. I also discussed with the patient that there may be a patient responsible charge related to this service. The patient expressed understanding and agreed to proceed.   History of Present Illness: Patient is a 53 year old female with asthma who presents to the clinic to discuss mask mandate while exercising.  Patient works out at [employer] and is concerned about the [employer].  She is okay with wearing a mask when she is lifting weights and going from 1 piece of equipment to another but she is very concerned about mask while doing cardio.  When she tries to exert herself while doing cardio she has a lot of breathing difficulties.  She would like a note that excuses her from this.  .. Active Ambulatory Problems    Diagnosis Date Noted  . NEVUS, ATYPICAL 11/10/2006  . HYPERLIPIDEMIA 12/26/2005  . DISORDER, DYSTHYMIC 12/26/2005  . SINUSITIS, ACUTE NOS 10/23/2006  . Perennial allergic rhinitis 12/23/2005  . EXCESSIVE MENSTRUATION 10/23/2006  . [place] DISEASE, CERVICAL 12/30/2006  . NECK PAIN 12/23/2005  . PAIN IN THORACIC SPINE 12/26/2005  . MYALGIA 10/23/2006  . HEADACHE 12/23/2005  . IUD (intrauterine device) in place 08/16/2015  . Mild intermittent asthma, uncomplicated 123456  . Secondary amenorrhea 08/25/2017  . Breast cancer screening, high risk patient 08/25/2017  . Family history of breast cancer in mother 08/25/2017  . White coat syndrome without diagnosis of hypertension  08/25/2017  . Abnormal weight gain 08/25/2017  . Migraine with aura and without status migrainosus, not intractable 08/25/2017  . Anxiety disorder 08/25/2017  . Overweight with body mass index (BMI) 25.0-29.9 09/23/2017  . Reactive airway disease 01/23/2018  . Encounter for long-term (current) use of medications 01/23/2018  . 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 Chronic left hip pain 07/23/2018  . Lipoma of left thigh 07/23/2018   Resolved Ambulatory Problems    Diagnosis Date Noted  . ANXIETY 12/23/2005  . Colon cancer screening 01/23/2018   Past Medical History:  Diagnosis Date  . Allergy   . Anxiety   . Asthma   . Heart murmur    Reviewed med, allergy, problem list.     Observations/Objective: No acute distress. No coughing.  No SOB.  .. Today's Vitals   12/15/18 0932  BP: 128/67  Weight: 162 lb (73.5 kg)  Height: 5\' 4"  (1.626 m)   Body mass index is 27.81 kg/m.    Assessment and Plan: Marland KitchenMarland KitchenAvneet was seen today for asthma.  Diagnoses and all orders for this visit:  Mild intermittent asthma, uncomplicated  Perennial allergic rhinitis  Mild intermittent reactive airway disease without complication   Discussed with patient in detail the reason for mass mandate.  We discussed that Covid cases are all-time high.  I did write her a note to excuse her from wearing a mask during moderate to high intensity cardio exercise.  Also discussed that maybe during the pandemic she should consider exercising in some other form until cases are on the way back down.  Also told her to consider the mass with ventilation valves.  Letter is in EMR.    Follow Up Instructions:    I discussed the assessment and treatment plan with the patient. The patient was provided an opportunity to ask questions and all were answered. The patient agreed with the plan and demonstrated an understanding of the instructions.   The patient was advised to call  back or seek an in-person evaluation if the symptoms worsen or if the condition fails to improve as anticipated.  I provided 12 minutes of non-face-to-face time during this encounter.   Iran Planas, PA-C

## 2018-12-15 NOTE — Progress Notes (Deleted)
Patient wants to discuss mask mandate  At [employer] they require mask while exercising  She would like a note to not wear a mask due to asthma during exercise

## 2018-12-25 ENCOUNTER — Ambulatory Visit: Payer: 59 | Admitting: Sports Medicine

## 2019-01-31 ENCOUNTER — Encounter: Payer: Self-pay | Admitting: Emergency Medicine

## 2019-01-31 ENCOUNTER — Emergency Department
Admission: EM | Admit: 2019-01-31 | Discharge: 2019-01-31 | Disposition: A | Discharge status: Home / Self Care | Payer: 59 | Source: Home / Self Care

## 2019-01-31 ENCOUNTER — Other Ambulatory Visit: Payer: Self-pay

## 2019-01-31 DIAGNOSIS — R112 Nausea with vomiting, unspecified: Secondary | ICD-10-CM | POA: Diagnosis not present

## 2019-01-31 DIAGNOSIS — E860 Dehydration: Secondary | ICD-10-CM

## 2019-01-31 DIAGNOSIS — Z20822 Contact with and (suspected) exposure to covid-19: Secondary | ICD-10-CM

## 2019-01-31 DIAGNOSIS — R109 Unspecified abdominal pain: Secondary | ICD-10-CM | POA: Diagnosis not present

## 2019-01-31 MED ORDER — SODIUM CHLORIDE 0.9 % IV BOLUS
1000.0000 mL | Freq: Once | INTRAVENOUS | Status: AC
  Administered 2019-01-31: 12:00:00 1000 mL via INTRAVENOUS

## 2019-01-31 MED ORDER — METOCLOPRAMIDE HCL 5 MG/ML IJ SOLN
10.0000 mg | Freq: Once | INTRAMUSCULAR | Status: AC
  Administered 2019-01-31: 10 mg via SUBCUTANEOUS

## 2019-01-31 MED ORDER — ONDANSETRON 4 MG PO TBDP
4.0000 mg | ORAL_TABLET | Freq: Once | ORAL | Status: AC
  Administered 2019-01-31: 11:00:00 4 mg via ORAL

## 2019-01-31 MED ORDER — DICYCLOMINE HCL 20 MG PO TABS: 20.0000 mg | ORAL_TABLET | Freq: Two times a day (BID) | ORAL | 0 refills | Status: DC

## 2019-01-31 MED ORDER — METOCLOPRAMIDE HCL 5 MG PO TABS: 5.0000 mg | ORAL_TABLET | Freq: Four times a day (QID) | ORAL | 0 refills | Status: DC | PRN

## 2019-01-31 NOTE — ED Triage Notes (Signed)
Patient c/o vomiting, headache, body chills since last nigfht, no exposure.

## 2019-01-31 NOTE — ED Notes (Signed)
Rechecked temp with c/o chills- 97.8, applied more blankets. Will continue to monitor.

## 2019-01-31 NOTE — Discharge Instructions (Addendum)
I am prescribing you metoclopramide 5 mg to 10 mg every 6 hours as needed for nausea.  Of also prescribed dicyclomine which will help with your abdominal spasm you may take this 2 times daily as needed.  Advance diet as tolerated however force fluids to reduce the risk of dehydration.  If symptoms worsen or do not improve follow-up with primary care provider for additional work-up.  Your COVID 19 results will be available in 48-72 hours. Negative results are immediately resulted to Mychart. All positive results are communicated with a phone call from our office.

## 2019-01-31 NOTE — ED Provider Notes (Signed)
[facility]    CSN: [id number] Arrival date & time: 01/31/19  1045      History   Chief Complaint Chief Complaint  Patient presents with  . Emesis    HPI Adrienne Lara is a 54 y.o. female.   HPI   Patient presents persistent nausea with vomiting occurring after a night in which she consumed alcoholic beverages in volumes more than she routinely drinks.  Patient has since developed she will and feels warm although afebrile.  She endorses abdominal spasm however no acute abdominal pain.  She denies any dysuria or flank pain.  She is afebrile.  She has been unable to tolerate anything by mouth around midnight last night.  She endorses no known sick contacts however has been in contact with an individual who had previously tested positive for Covid although the person had been outside of their quarantine window.  She had contact with this person a week and a half ago.  No sick contacts in her home. N&V symptoms developed abruptly last night.  Past Medical History:  Diagnosis Date  . Allergy   . Anxiety   . Asthma   . Heart murmur     Patient Active Problem List   Diagnosis Date Noted  . 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 Chronic left hip pain 07/23/2018  . Lipoma of left thigh 07/23/2018  . Reactive airway disease 01/23/2018  . Encounter for long-term (current) use of medications 01/23/2018  . Overweight with body mass index (BMI) 25.0-29.9 09/23/2017  . Secondary amenorrhea 08/25/2017  . Breast cancer screening, high risk patient 08/25/2017  . Family history of breast cancer in mother 08/25/2017  . White coat syndrome without diagnosis of hypertension 08/25/2017  . Abnormal weight gain 08/25/2017  . Migraine with aura and without status migrainosus, not intractable 08/25/2017  . Anxiety disorder 08/25/2017  . IUD (intrauterine device) in place 08/16/2015  . Mild intermittent asthma, uncomplicated 123456  . [place]  DISEASE, CERVICAL 12/30/2006  . NEVUS, ATYPICAL 11/10/2006  . SINUSITIS, ACUTE NOS 10/23/2006  . EXCESSIVE MENSTRUATION 10/23/2006  . MYALGIA 10/23/2006  . HYPERLIPIDEMIA 12/26/2005  . DISORDER, DYSTHYMIC 12/26/2005  . PAIN IN THORACIC SPINE 12/26/2005  . Perennial allergic rhinitis 12/23/2005  . NECK PAIN 12/23/2005  . HEADACHE 12/23/2005    Past Surgical History:  Procedure Laterality Date  . APPENDECTOMY    . BREAST BIOPSY Right   . INTRAUTERINE DEVICE (IUD) INSERTION    . REDUCTION MAMMAPLASTY      OB History   No obstetric history on file.      Home Medications    Prior to Admission medications   Medication Sig Start Date End Date Taking? Authorizing Provider  albuterol (PROVENTIL HFA;VENTOLIN HFA) 108 (90 Base) MCG/ACT inhaler Inhale 1-2 puffs into the lungs every 4 (four) hours as needed for wheezing or shortness of breath (bronchospasm). 01/22/18  Yes Trixie Dredge, PA-C  DULoxetine (CYMBALTA) 20 MG capsule Take 1 capsule (20 mg total) by mouth 2 (two) times daily. Needs appt 11/26/18  Yes Emeterio Reeve, DO  levonorgestrel (MIRENA, 52 MG,) 20 MCG/24HR IUD by Intrauterine route.   Yes [provider]  loratadine (CLARITIN) 10 MG tablet Take 10 mg by mouth daily as needed for allergies.   Yes [provider]  montelukast (SINGULAIR) 5 MG chewable tablet Chew 1 tablet (5 mg total) by mouth at bedtime. 01/22/18  Yes Trixie Dredge, PA-C  topiramate (TOPAMAX) 50 MG tablet  Take 1 tablet (50 mg total) by mouth 2 (two) times daily. Needs appt 11/26/18  Yes Emeterio Reeve, DO    Family History Family History  Problem Relation Age of Onset  . Breast cancer Mother 81  . Obesity Mother   . Obesity Sister   . Heart attack Sister   . Alcohol abuse Sister   . Alcohol abuse Brother     Social History Social History   Tobacco Use  . Smoking status: Former Research scientist (life sciences)  . Smokeless tobacco: Never Used  Substance Use Topics  .  Alcohol use: Yes    Alcohol/week: 7.0 standard drinks    Types: 7 Standard drinks or equivalent per week  . Drug use: Never     Allergies   Penicillins, Sulfa antibiotics, and Sulfonamide derivatives   Review of Systems Review of Systems Pertinent negatives listed in HPI  Physical Exam Triage Vital Signs ED Triage Vitals  Enc Vitals Group     BP 01/31/19 1107 (!) 151/89     Pulse Rate 01/31/19 1107 65     Resp --      Temp 01/31/19 1107 98.2 F (36.8 C)     Temp Source 01/31/19 1107 Oral     SpO2 01/31/19 1107 98 %     Weight 01/31/19 1110 165 lb (74.8 kg)     Height 01/31/19 1110 5\' 4"  (1.626 m)     Head Circumference --      Peak Flow --      Pain Score 01/31/19 1110 0     Pain Loc --      Pain Edu? --      Excl. in [place]? --    No data found.  Updated Vital Signs BP (!) 151/89 (BP Location: Left Arm)   Pulse 65   Temp 98.2 F (36.8 C) (Oral)   Ht 5\' 4"  (1.626 m)   Wt 165 lb (74.8 kg)   SpO2 98%   BMI 28.32 kg/m   Visual Acuity Right Eye Distance:   Left Eye Distance:   Bilateral Distance:    Right Eye Near:   Left Eye Near:    Bilateral Near:     Physical Exam General appearance: alert, mild distress, ill-appearing, actively vomiting. Head: Normocephalic, without obvious abnormality, atraumatic Respiratory: Respirations even and unlabored, normal respiratory rate Heart: rate and rhythm normal. Abdomen: BS diminished , no distention, no rebound tenderness Extremities: No gross deformities Skin: Skin color, texture, turgor normal. No rashes seen  Psych: Appropriate mood and affect. Neurologic:Alert, oriented to person, place, and time, thought content appropriate.  UC Treatments / Results  Labs (all labs ordered are listed, but only abnormal results are displayed) Labs Reviewed - No data to display  EKG   Radiology No results found.  Procedures Procedures (including critical care time)  Medications Ordered in UC Medications    ondansetron (ZOFRAN-ODT) disintegrating tablet 4 mg (4 mg Oral Given 01/31/19 1120)  ondansetron (ZOFRAN-ODT) disintegrating tablet 4 mg (4 mg Oral Given 01/31/19 1121)  sodium chloride 0.9 % bolus 1,000 mL (0 mLs Intravenous Stopped 01/31/19 1326)  metoCLOPramide (REGLAN) injection 10 mg (10 mg Subcutaneous Given 01/31/19 1210)    Initial Impression / Assessment and Plan / UC Course  I have reviewed the triage vital signs and the nursing notes.  Pertinent labs & imaging results that were available during my care of the patient were reviewed by me and considered in my medical decision making (see chart for details).   Acute onset  nausea and vomiting abdominal spasms, only been eating which she consumed has been normal volumes of alcohol.  Patient with dehydrated and persistent vomiting while in clinic.  Patient bolused with a liter of normal saline and given subcutaneous Reglan after vomiting Zofran ODT.  Prior to discharge patient had relief of vomiting and was able to tolerate oral intake of fluid.  Patient discharged with dicyclomine as needed for abdominal spasm and Reglan as needed for nausea.  Patient encouraged to advance diet as tolerated however hydrate well to prevent dehydration.  Strict return precautions if symptoms return or do not resolve.  Patient was Covid tested during encounter today.  Results are pending. Clinical Impressions(s) / UC Diagnoses   Final diagnoses:  Non-intractable vomiting with nausea, unspecified vomiting type  Encounter for laboratory testing for COVID-19 virus  Dehydration  Abdominal spasms     Discharge Instructions     I am prescribing you metoclopramide 5 mg to 10 mg every 6 hours as needed for nausea.  Of also prescribed dicyclomine which will help with your abdominal spasm you may take this 2 times daily as needed.  Advance diet as tolerated however force fluids to reduce the risk of dehydration.  If symptoms worsen or do not improve follow-up with  primary care provider for additional work-up.  Your COVID 19 results will be available in 48-72 hours. Negative results are immediately resulted to Mychart. All positive results are communicated with a phone call from our office.     ED Prescriptions    Medication Sig Dispense Auth. Provider   metoCLOPramide (REGLAN) 5 MG tablet Take 1-2 tablets (5-10 mg total) by mouth every 6 (six) hours as needed for nausea or vomiting. 12 tablet Scot Jun, FNP   dicyclomine (BENTYL) 20 MG tablet Take 1 tablet (20 mg total) by mouth 2 (two) times daily. 10 tablet Scot Jun, FNP     PDMP not reviewed this encounter.  Scot Jun, FNP 02/02/19 1819

## 2019-02-01 LAB — NOVEL CORONAVIRUS, NAA: SARS-CoV-2, NAA: NOT DETECTED

## 2019-02-24 ENCOUNTER — Encounter: Payer: Self-pay | Admitting: Physician Assistant

## 2019-02-24 ENCOUNTER — Telehealth (INDEPENDENT_AMBULATORY_CARE_PROVIDER_SITE_OTHER): Payer: 59 | Admitting: Physician Assistant

## 2019-02-24 VITALS — Ht 64.0 in | Wt 159.0 lb

## 2019-02-24 DIAGNOSIS — G43109 Migraine with aura, not intractable, without status migrainosus: Secondary | ICD-10-CM

## 2019-02-24 DIAGNOSIS — J3089 Other allergic rhinitis: Secondary | ICD-10-CM

## 2019-02-24 DIAGNOSIS — Z9109 Other allergy status, other than to drugs and biological substances: Secondary | ICD-10-CM

## 2019-02-24 DIAGNOSIS — J0141 Acute recurrent pansinusitis: Secondary | ICD-10-CM

## 2019-02-24 DIAGNOSIS — F419 Anxiety disorder, unspecified: Secondary | ICD-10-CM

## 2019-02-24 DIAGNOSIS — Z1211 Encounter for screening for malignant neoplasm of colon: Secondary | ICD-10-CM | POA: Diagnosis not present

## 2019-02-24 MED ORDER — DULOXETINE HCL 20 MG PO CPEP: 20.0000 mg | ORAL_CAPSULE | Freq: Every day | ORAL | 1 refills | Status: DC

## 2019-02-24 MED ORDER — AZITHROMYCIN 250 MG PO TABS: ORAL_TABLET | ORAL | 0 refills | Status: DC

## 2019-02-24 MED ORDER — TOPIRAMATE 50 MG PO TABS: 50.0000 mg | ORAL_TABLET | Freq: Two times a day (BID) | ORAL | 1 refills | Status: DC

## 2019-02-24 NOTE — Progress Notes (Signed)
Adrienne Lara patient.  Needs RFs Duloxetine, written for BID, taking once a day

## 2019-02-24 NOTE — Progress Notes (Addendum)
Patient ID: Adrienne Lara, female   DOB: 1965-02-05, 54 y.o.   MRN: [id number] .Marland KitchenVirtual Visit via Video Note  I connected with Adrienne Lara on 02/25/19 at 10:10 AM EST by a video enabled telemedicine application and verified that I am speaking with the correct person using two identifiers.  Location: Patient: home Provider: clinic   I discussed the limitations of evaluation and management by telemedicine and the availability of in person appointments. The patient expressed understanding and agreed to proceed.  History of Present Illness: Pt is a 54 yo female with migraines, anxiety, allergies who calls into the clinic for refills and re-establish care.   Pt is doing well. She is much less anxious. She is taking cymbalta once a day. Her symptoms are well controlled. No side effects.   Migraine days have been 0 for the last 3 months. topamax is working great. No problems or concerns.   Pt does have ongoing allergies both seasonal and environmental. She is getting allergy shots. She does have recurrent sinusitis. She feels one coming on. She is having sinus pressure, headache, feeling fatigued. She is taking allergy medication, facial massage, nettie potts, nasal sprays. Going on for about 1 week. No fever, chills, loss of smell or taste, GI symptoms, SOB.    [name]. Active Ambulatory Problems    Diagnosis Date Noted  . NEVUS, ATYPICAL 11/10/2006  . HYPERLIPIDEMIA 12/26/2005  . DISORDER, DYSTHYMIC 12/26/2005  . SINUSITIS, ACUTE NOS 10/23/2006  . Perennial allergic rhinitis 12/23/2005  . EXCESSIVE MENSTRUATION 10/23/2006  . [place] DISEASE, CERVICAL 12/30/2006  . NECK PAIN 12/23/2005  . PAIN IN THORACIC SPINE 12/26/2005  . MYALGIA 10/23/2006  . HEADACHE 12/23/2005  . IUD (intrauterine device) in place 08/16/2015  . Mild intermittent asthma, uncomplicated 123456  . Secondary amenorrhea 08/25/2017  . Breast cancer screening, high risk patient 08/25/2017  . Family history of  breast cancer in mother 08/25/2017  . White coat syndrome without diagnosis of hypertension 08/25/2017  . Abnormal weight gain 08/25/2017  . Migraine with aura and without status migrainosus, not intractable 08/25/2017  . Anxiety disorder 08/25/2017  . Overweight with body mass index (BMI) 25.0-29.9 09/23/2017  . Reactive airway disease 01/23/2018  . Encounter for long-term (current) use of medications 01/23/2018  . 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 Chronic left hip pain 07/23/2018  . Lipoma of left thigh 07/23/2018  . Environmental allergies 02/24/2019   Resolved Ambulatory Problems    Diagnosis Date Noted  . ANXIETY 12/23/2005  . Colon cancer screening 01/23/2018   Past Medical History:  Diagnosis Date  . Allergy   . Anxiety   . Asthma   . Heart murmur    Reviewed med, allergy, problem list.   Observations/Objective: No acute distress Normal appearance and mood.  No cough or labored breathing.  Tenderness when patients presses over her maxillary and frontal sinuses.   .. Today's Vitals   02/24/19 0942  Weight: 159 lb (72.1 kg)  Height: 5\' 4"  (1.626 m)   Body mass index is 27.29 kg/m.    Assessment and Plan: Marland KitchenMarland KitchenDiagnoses and all orders for this visit:  Migraine with aura and without status migrainosus, not intractable -     topiramate (TOPAMAX) 50 MG tablet; Take 1 tablet (50 mg total) by mouth 2 (two) times daily.  Screen for colon cancer -     Cologuard  Anxiety disorder, unspecified type -     DULoxetine (CYMBALTA) 20 MG capsule; Take 1  capsule (20 mg total) by mouth daily.  Acute recurrent pansinusitis -     azithromycin (ZITHROMAX) 250 MG tablet; Take 2 tablets now and then one tablet for 4 days.  Perennial allergic rhinitis  Environmental allergies   Medications refilled.  Treated for recurrent sinus infection. Follow up as needed.  Via mychart sent allergy list of foods she is having shots for.  Follow up in  6 months for labs and CPE.   Spent 15 minutes with patient and with chart review.     Follow Up Instructions:    I discussed the assessment and treatment plan with the patient. The patient was provided an opportunity to ask questions and all were answered. The patient agreed with the plan and demonstrated an understanding of the instructions.   The patient was advised to call back or seek an in-person evaluation if the symptoms worsen or if the condition fails to improve as anticipated.   Iran Planas, PA-C

## 2019-03-02 ENCOUNTER — Other Ambulatory Visit: Payer: Self-pay | Admitting: Neurology

## 2019-03-02 DIAGNOSIS — J3089 Other allergic rhinitis: Secondary | ICD-10-CM

## 2019-03-02 DIAGNOSIS — J4520 Mild intermittent asthma, uncomplicated: Secondary | ICD-10-CM

## 2019-03-02 MED ORDER — MONTELUKAST SODIUM 5 MG PO CHEW: 5.0000 mg | CHEWABLE_TABLET | Freq: Every day | ORAL | 3 refills | Status: DC

## 2019-05-07 IMAGING — MG DIGITAL DIAGNOSTIC UNILATERAL RIGHT MAMMOGRAM WITH TOMO AND CAD
6 of 12 series · 6 of 36 positions shown · non-contrast
Comparison: Previous exams including recent screening mammogram
dated 09/17/2017.

CLINICAL DATA: Patient returns today to evaluate a possible
architectural distortion within the RIGHT breast identified on
recent screening mammogram.

EXAM:
DIGITAL DIAGNOSTIC RIGHT MAMMOGRAM WITH CAD AND TOMO
ULTRASOUND RIGHT BREAST

[R CC synth-2D (1 of 5)]
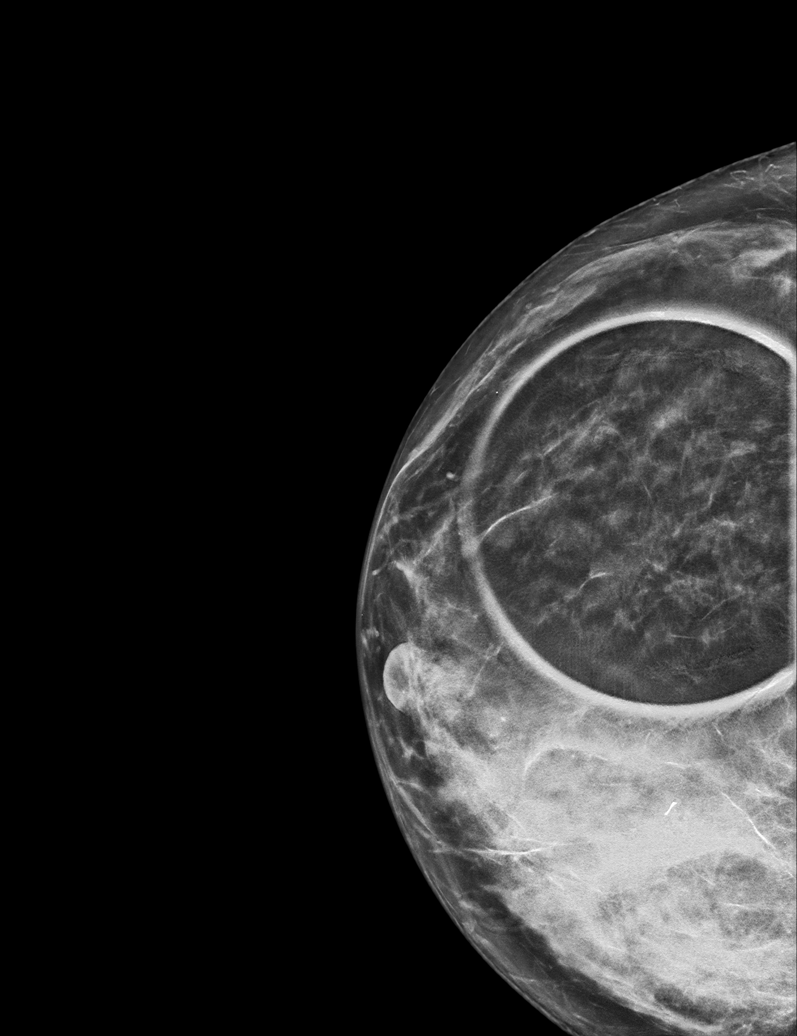

[R CC synth-2D (2 of 5)]
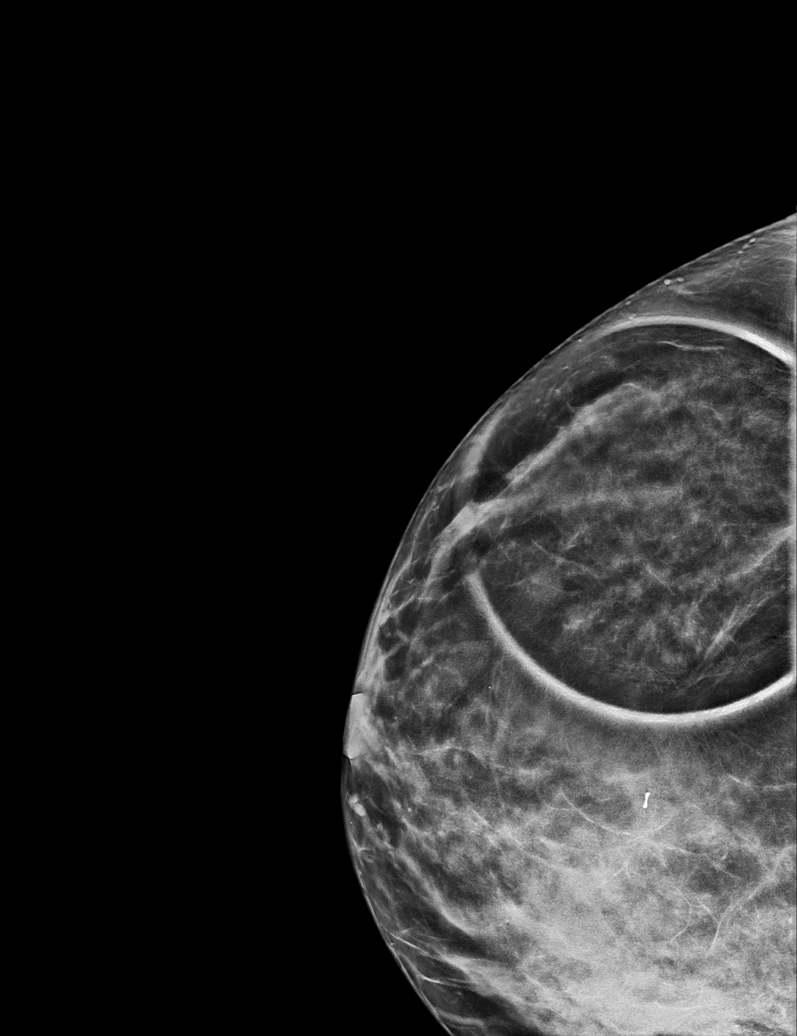

[R CC synth-2D (3 of 5)]
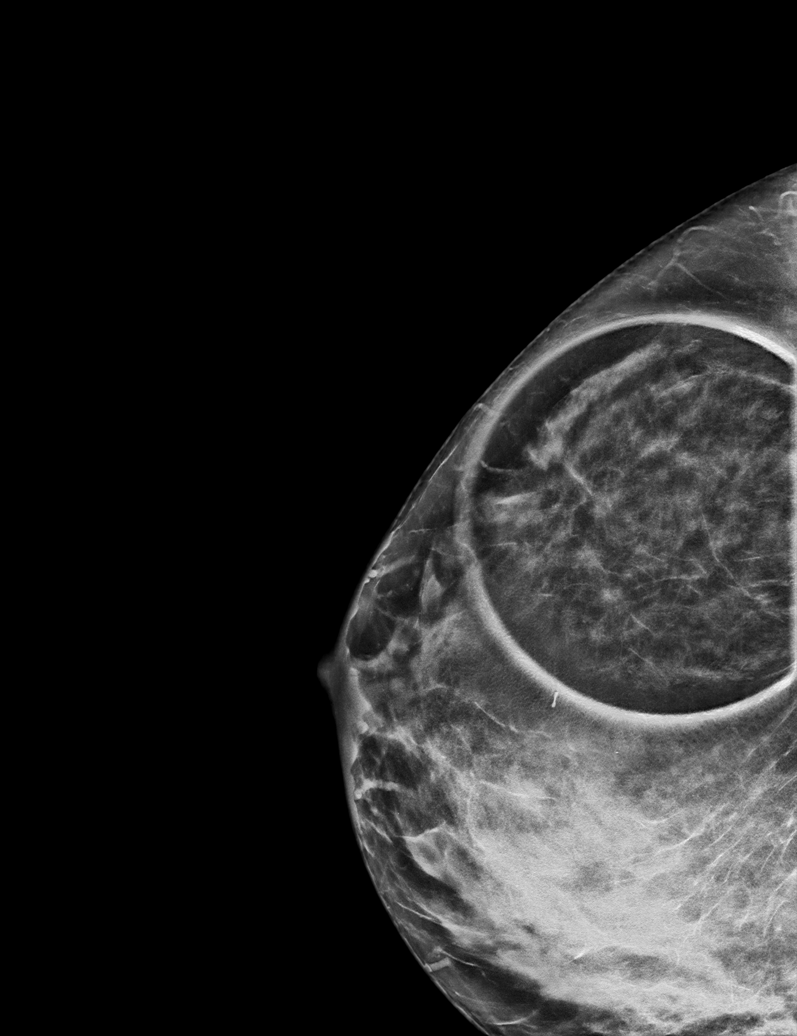

[R CC synth-2D (4 of 5)]
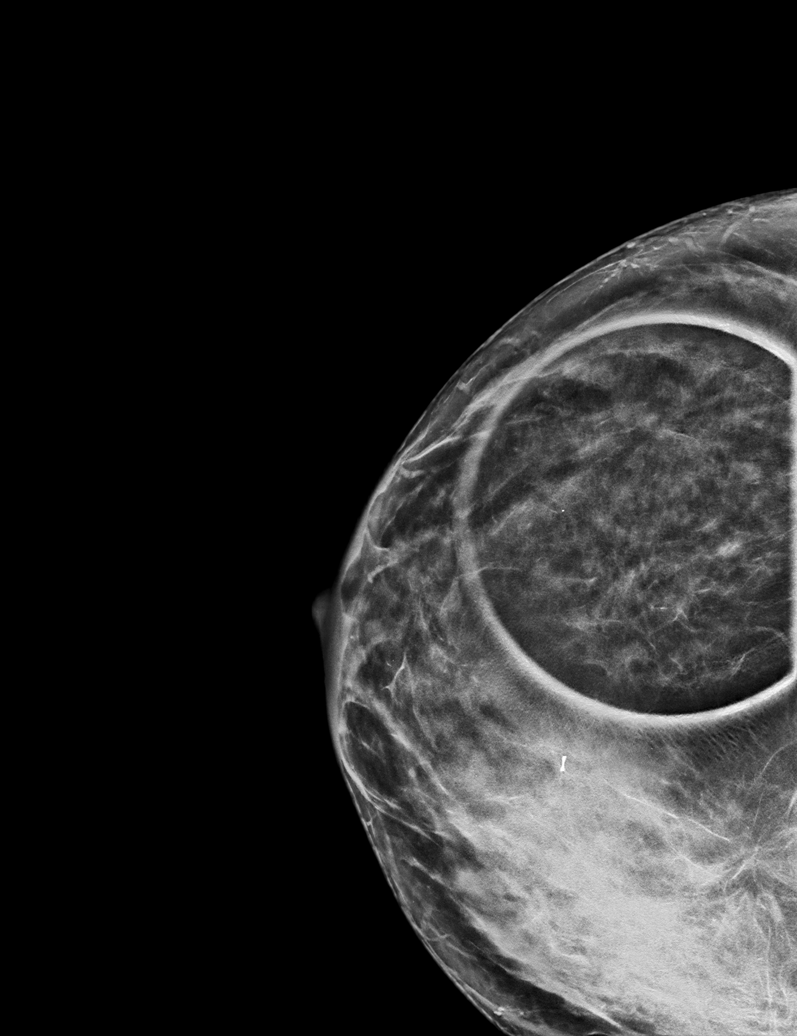

[R CC synth-2D (5 of 5)]
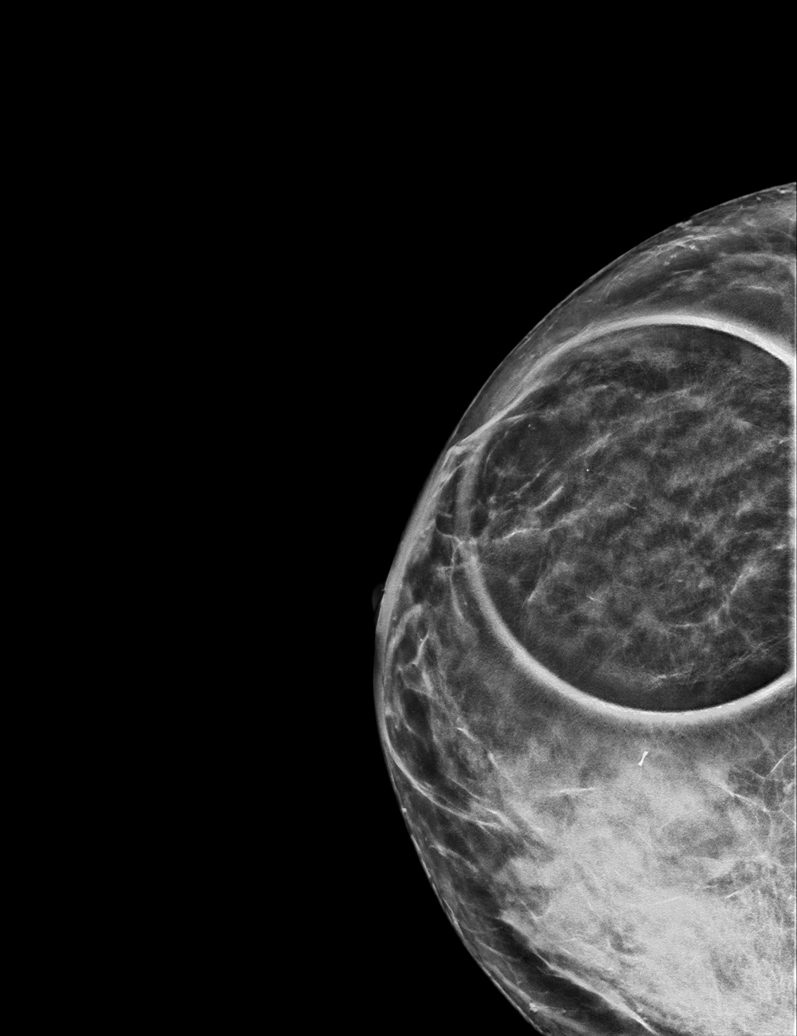

[R MLO synth-2D]
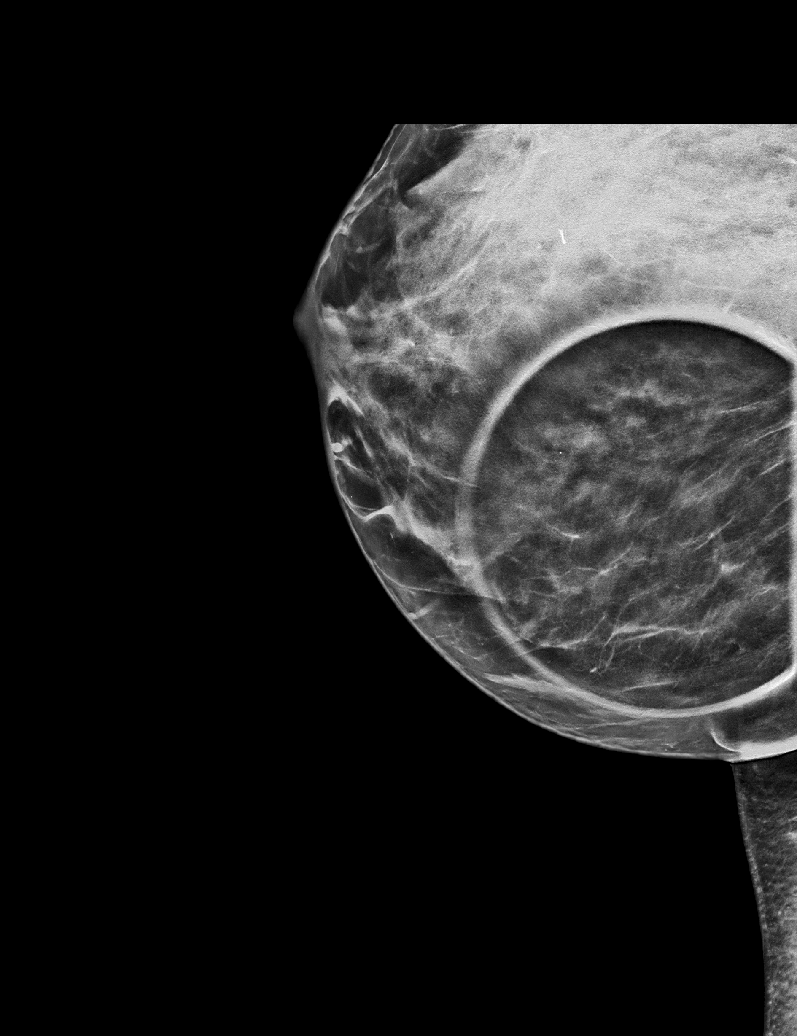

[6 of 36 positions shown; findings below may reference images not displayed]

ACR Breast Density Category c: The breast tissue is heterogeneously
dense, which may obscure small masses.
FINDINGS: Additional diagnostic views were obtained today with spot
compression and 3D tomosynthesis. There is a persistent possible
subtle architectural distortion within the outer RIGHT breast, CC
views only, most conspicuous on the second spot compression CC slice
35, probable persistent correlate on the rolled medial CC spot
compression slice 27. No correlate is identified on the spot
compression MLO view.

Mammographic images were processed with CAD.

Targeted ultrasound is performed, showing no sonographic correlate
for the possible subtle architectural distortion within the outer
RIGHT breast.

RIGHT axilla was evaluated with ultrasound showing no enlarged or
morphologically abnormal lymph nodes.
IMPRESSION: Persistent possible subtle architectural distortion within the outer
RIGHT breast, at posterior depth, CC views only, slice positions
detailed above, more conspicuous on the earlier screening mammogram
of 09/17/2017. This may be postsurgical change related to a previous
reduction mammoplasty. Stereotactic biopsy is recommended to ensure
benignity.

RECOMMENDATION:
Stereotactic biopsy for the possible subtle architectural distortion
within the outer RIGHT breast.

the possible subtle architectural distortion at the time of biopsy.
Patient is aware that this may result in a six-month follow-up
diagnostic mammogram.

Stereotactic biopsy is scheduled for [REDACTED].

I have discussed the findings and recommendations with the patient.
Results were also provided in writing at the conclusion of the
visit. If applicable, a reminder letter will be sent to the patient
regarding the next appointment.

BI-RADS CATEGORY  4: Suspicious.

## 2019-05-09 IMAGING — MG MM CLIP PLACEMENT
4 series · 4 of 12 positions shown · non-contrast
Comparison: Previous exam(s).

CLINICAL DATA: Post biopsy mammogram of the right breast for clip
placement.

EXAM:
DIAGNOSTIC RIGHT MAMMOGRAM POST STEREOTACTIC BIOPSY

[R ML synth-2D]
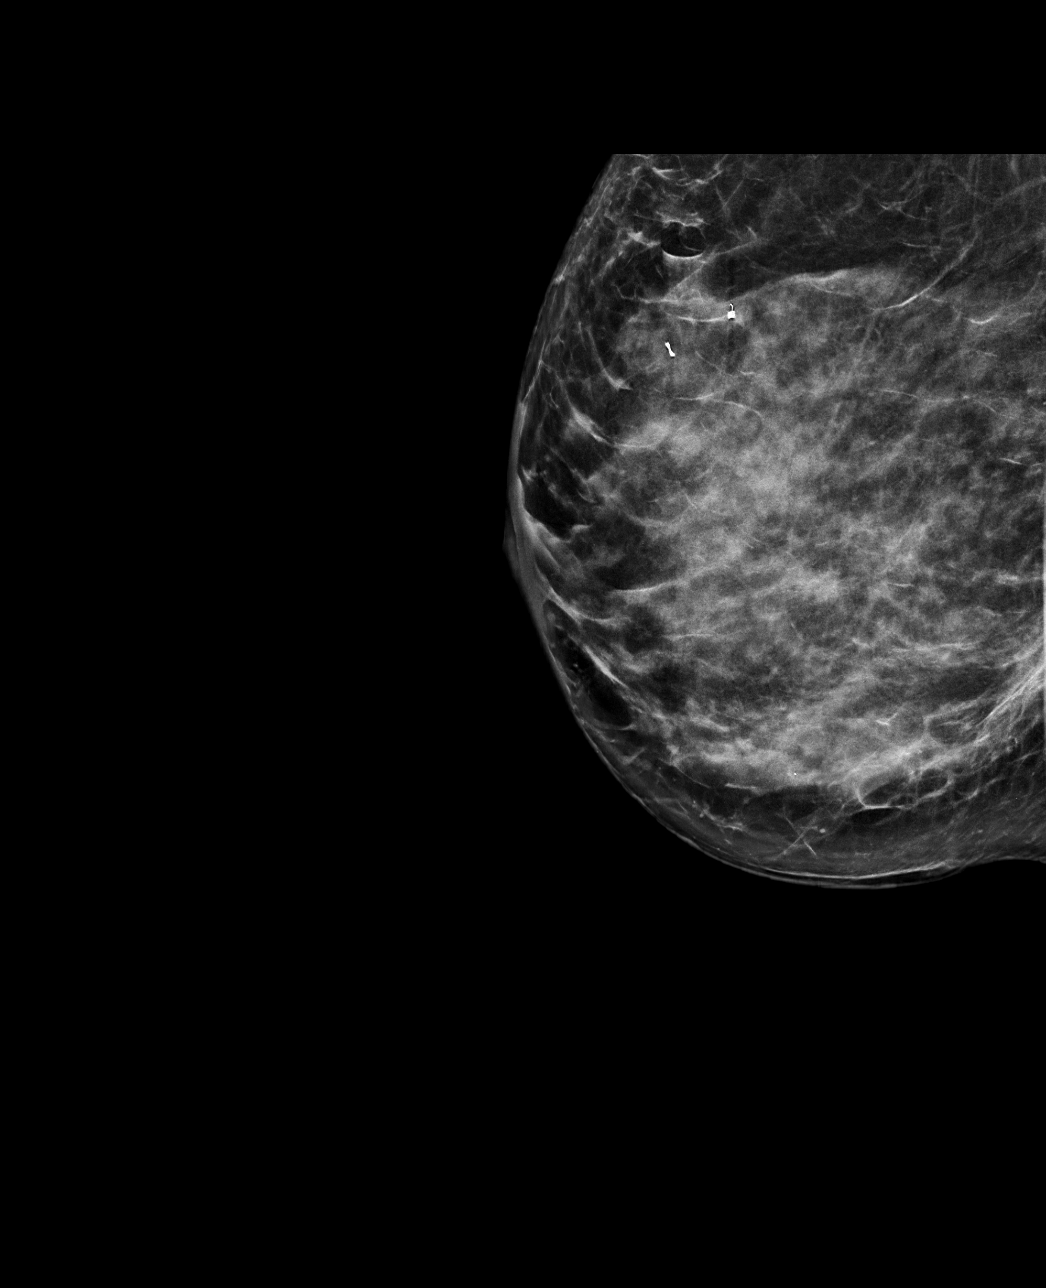

[R CC synth-2D]
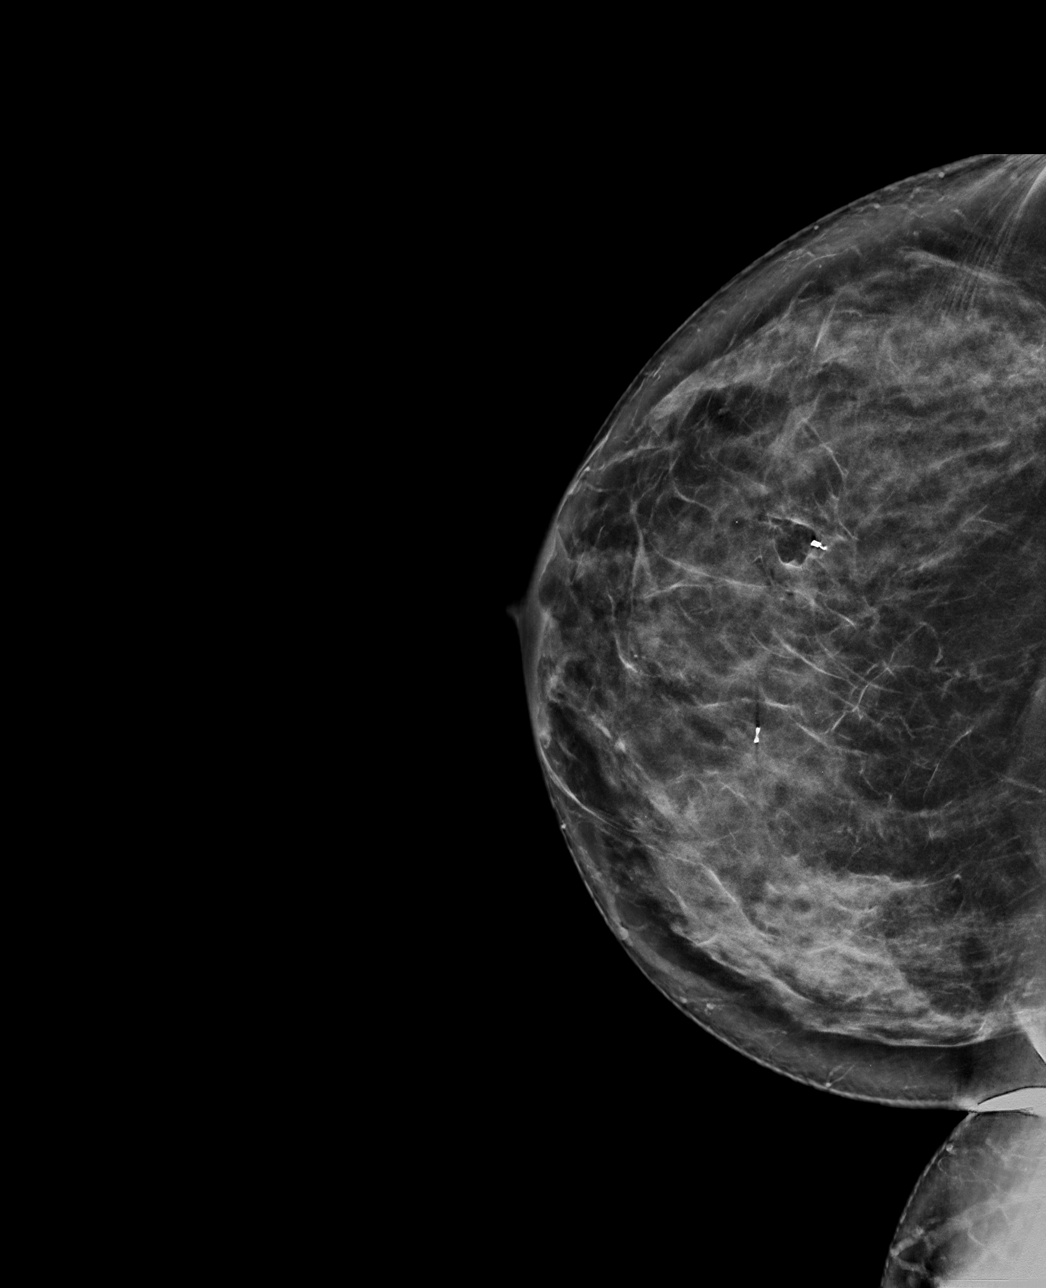

[R CC tomo · tomo slice 41/81.0]
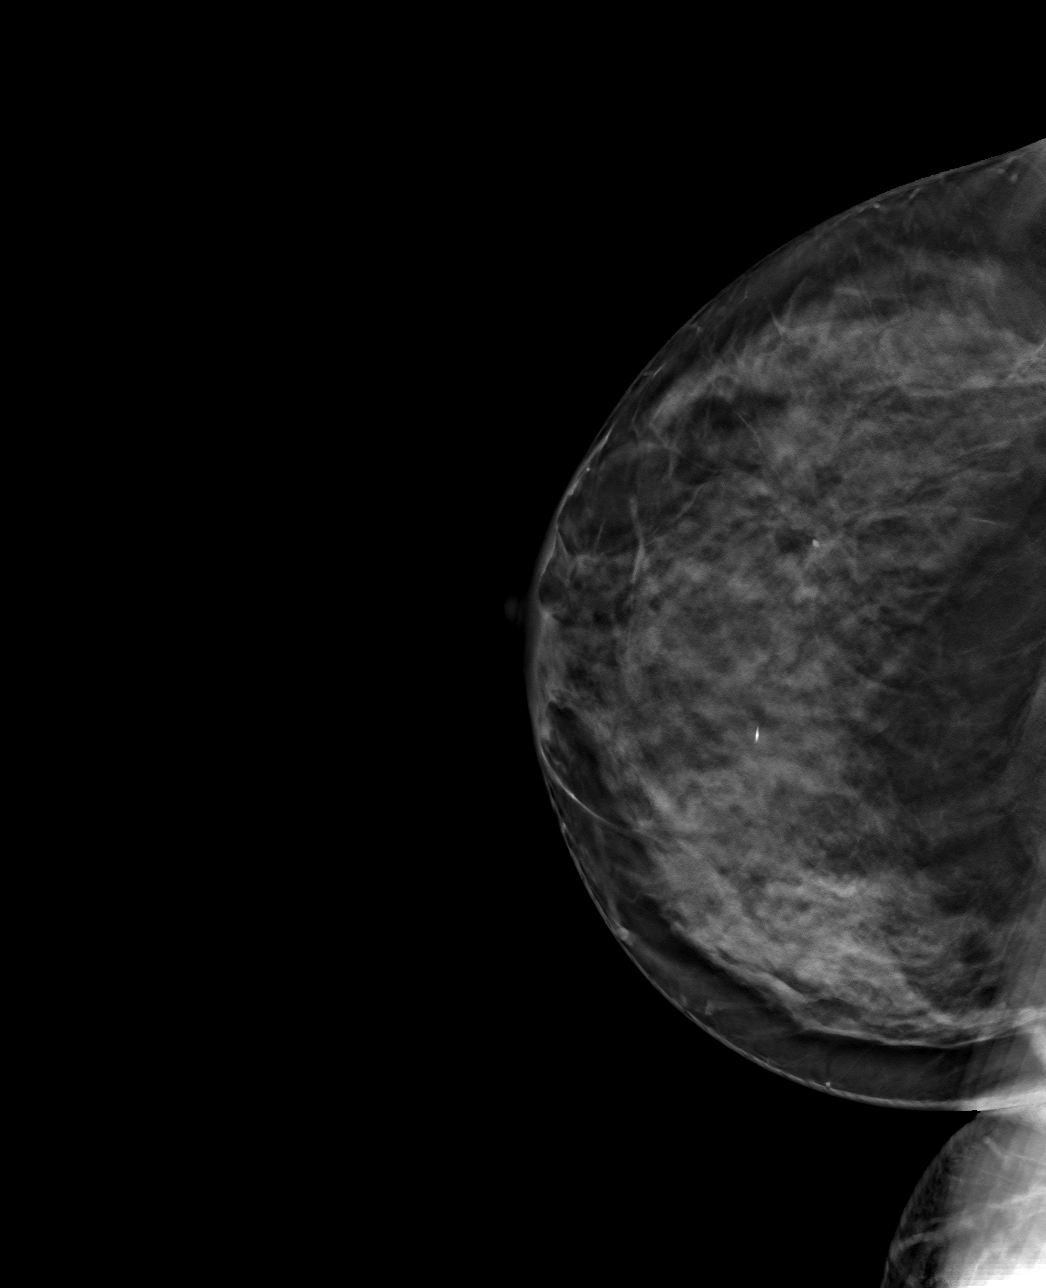

[R ML tomo · tomo slice 39/78.0]
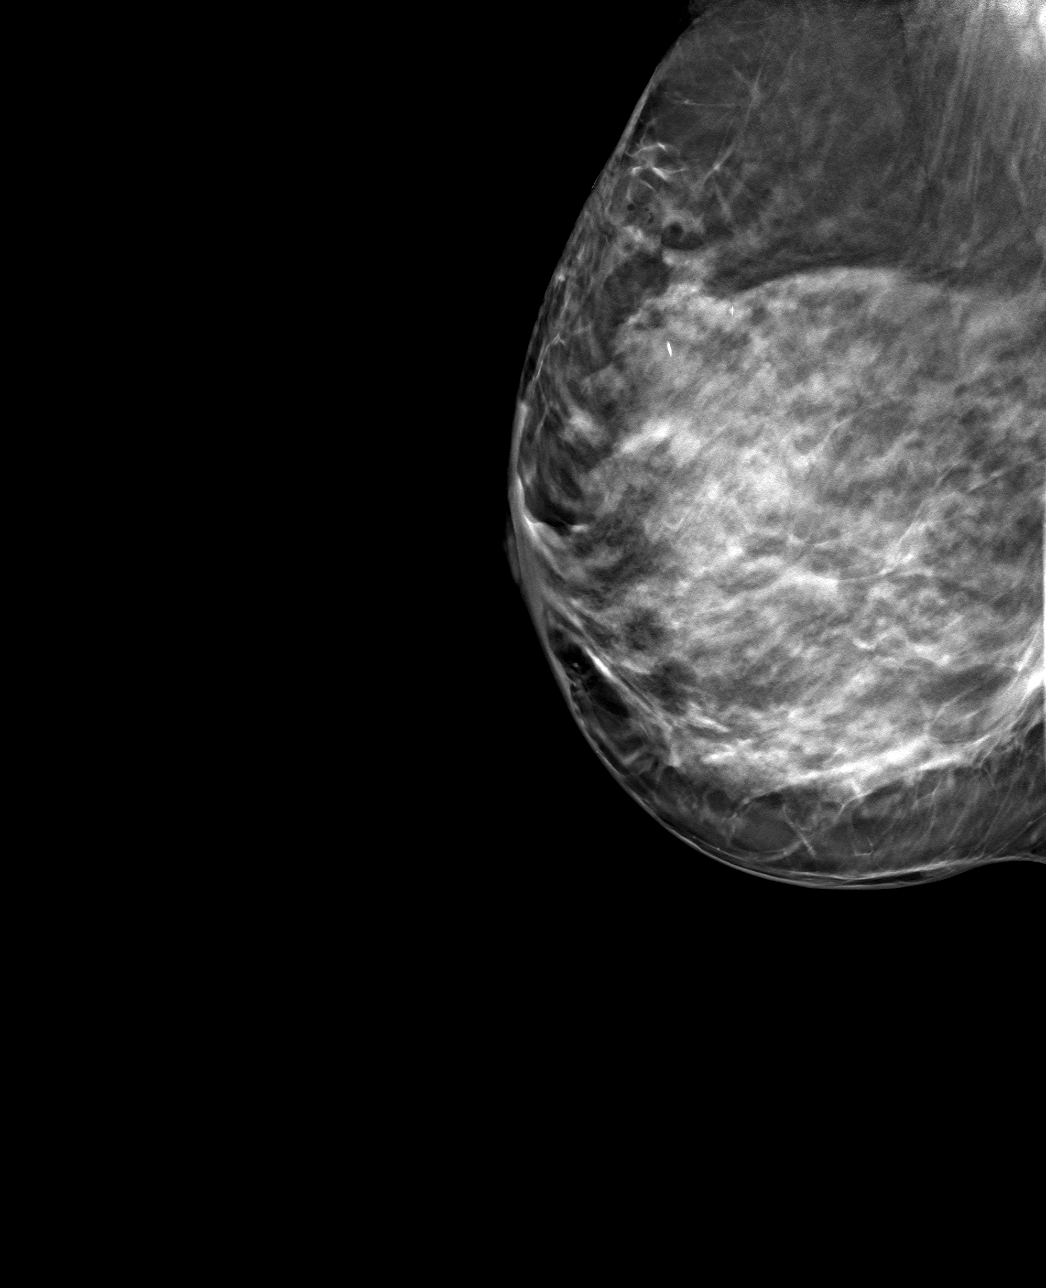

[4 of 12 positions shown; findings below may reference images not displayed]

FINDINGS: Mammographic images were obtained following stereotactic guided
biopsy of possible distortion in the upper-outer right breast. The
coil shaped biopsy marking clip is well positioned at the site of
biopsy in the upper-outer right breast.
IMPRESSION: Appropriate positioning of the coil shaped biopsy marking clip in
the upper-outer right breast.

Final Assessment: Post Procedure Mammograms for Marker Placement

## 2019-07-16 ENCOUNTER — Ambulatory Visit (INDEPENDENT_AMBULATORY_CARE_PROVIDER_SITE_OTHER): Payer: 59 | Admitting: Family Medicine

## 2019-07-16 ENCOUNTER — Encounter: Payer: Self-pay | Admitting: Family Medicine

## 2019-07-16 VITALS — BP 119/71 | HR 75 | Ht 64.0 in | Wt 160.0 lb

## 2019-07-16 DIAGNOSIS — J069 Acute upper respiratory infection, unspecified: Secondary | ICD-10-CM

## 2019-07-16 DIAGNOSIS — J4531 Mild persistent asthma with (acute) exacerbation: Secondary | ICD-10-CM | POA: Diagnosis not present

## 2019-07-16 MED ORDER — PREDNISONE 20 MG PO TABS: ORAL_TABLET | ORAL | 0 refills | Status: AC

## 2019-07-16 MED ORDER — HYDROCOD POLST-CPM POLST ER 10-8 MG/5ML PO SUER: 5.0000 mL | Freq: Two times a day (BID) | ORAL | 0 refills | Status: AC | PRN

## 2019-07-16 NOTE — Progress Notes (Addendum)
Established Patient Office Visit  Subjective:  Patient ID: Adrienne Lara, female    DOB: 03/22/1965  Age: 54 y.o. MRN: [id number]  CC:  Chief Complaint  Patient presents with  . Cough  . Chest Pain    HPI Adrienne Lara presents for shortness of breath and cough.  History of asthma.  Last seen in February.  Currently on Singulair and Zyrtec D. Sxs started on Tuesday.  Daughter had been sick as well. Albuterol has been helping she hasn't had her COVID vaccine.  No ST but some irritation.  No fever, sweats chills or GI sxs.  + HA from cough.  Had a lot of chest tightness over the upper part of the chest.  She is also had a lot of nasal congestion and postnasal drip.  She reports she did a home Covid test and it was negative.  Past Medical History:  Diagnosis Date  . Allergy   . Anxiety   . Asthma   . Heart murmur     Past Surgical History:  Procedure Laterality Date  . APPENDECTOMY    . BREAST BIOPSY Right   . INTRAUTERINE DEVICE (IUD) INSERTION    . REDUCTION MAMMAPLASTY      Family History  Problem Relation Age of Onset  . Breast cancer Mother 68  . Obesity Mother   . Obesity Sister   . Heart attack Sister   . Alcohol abuse Sister   . Alcohol abuse Brother     Social History   Socioeconomic History  . Marital status: Married    Spouse name: Not on file  . Number of children: Not on file  . Years of education: Not on file  . Highest education level: Not on file  Occupational History  . Not on file  Tobacco Use  . Smoking status: Former Research scientist (life sciences)  . Smokeless tobacco: Never Used  Substance and Sexual Activity  . Alcohol use: Yes    Alcohol/week: 7.0 standard drinks    Types: 7 Standard drinks or equivalent per week  . Drug use: Never  . Sexual activity: Not Currently    Birth control/protection: I.U.D.  Other Topics Concern  . Not on file  Social History Narrative  . Not on file   Social Determinants of Health   Financial Resource Strain:    . Difficulty of Paying Living Expenses:   Food Insecurity:   . Worried About Charity fundraiser in the Last Year:   . Arboriculturist in the Last Year:   Transportation Needs:   . Film/video editor (Medical):   [name] Lack of Transportation (Non-Medical):   Physical Activity:   . Days of Exercise per Week:   . Minutes of Exercise per Session:   Stress:   . Feeling of Stress :   Social Connections:   . Frequency of Communication with Friends and Family:   . Frequency of Social Gatherings with Friends and Family:   . Attends Religious Services:   . Active Member of Clubs or Organizations:   . Attends Archivist Meetings:   [name] Marital Status:   Intimate Partner Violence:   . Fear of Current or Ex-Partner:   . Emotionally Abused:   [name] Physically Abused:   . Sexually Abused:     Outpatient Medications Prior to Visit  Medication Sig Dispense Refill  . albuterol (PROVENTIL HFA;VENTOLIN HFA) 108 (90 Base) MCG/ACT inhaler Inhale 1-2 puffs into the lungs every 4 (four) hours as  needed for wheezing or shortness of breath (bronchospasm). 1 Inhaler 2  . DULoxetine (CYMBALTA) 20 MG capsule Take 1 capsule (20 mg total) by mouth daily. 90 capsule 1  . ivermectin (STROMECTOL) 3 MG TABS tablet Take by mouth.    . levonorgestrel (MIRENA, 52 MG,) 20 MCG/24HR IUD by Intrauterine route.    . montelukast (SINGULAIR) 5 MG chewable tablet Chew 1 tablet (5 mg total) by mouth at bedtime. 90 tablet 3  . topiramate (TOPAMAX) 50 MG tablet Take 1 tablet (50 mg total) by mouth 2 (two) times daily. 180 tablet 1  . azithromycin (ZITHROMAX) 250 MG tablet Take 2 tablets now and then one tablet for 4 days. 6 tablet 0   No facility-administered medications prior to visit.    Allergies  Allergen Reactions  . Penicillins Hives  . Sulfa Antibiotics Hives  . Sulfonamide Derivatives     ROS Review of Systems    Objective:    Physical Exam Constitutional:      Appearance: She is well-developed.   HENT:     Head: Normocephalic and atraumatic.     Right Ear: External ear normal.     Left Ear: Tympanic membrane and external ear normal.     Ears:     Comments: Right TM blocked by cerumen.    Nose: Nose normal.  Eyes:     Conjunctiva/sclera: Conjunctivae normal.     Pupils: Pupils are equal, round, and reactive to light.  Neck:     Thyroid: No thyromegaly.  Cardiovascular:     Rate and Rhythm: Normal rate and regular rhythm.     Heart sounds: Normal heart sounds.  Pulmonary:     Effort: Pulmonary effort is normal.     Breath sounds: Normal breath sounds. No wheezing.  Musculoskeletal:     Cervical back: Neck supple.  Lymphadenopathy:     Cervical: No cervical adenopathy.  Skin:    General: Skin is warm and dry.  Neurological:     Mental Status: She is alert and oriented to person, place, and time.     BP 119/71   Pulse 75   Ht 5\' 4"  (1.626 m)   Wt 160 lb (72.6 kg)   SpO2 100%   BMI 27.46 kg/m  Wt Readings from Last 3 Encounters:  07/16/19 160 lb (72.6 kg)  02/24/19 159 lb (72.1 kg)  01/31/19 165 lb (74.8 kg)     Health Maintenance Due  Topic Date Due  . Hepatitis C Screening  Never done  . COVID-19 Vaccine (1) Never done  . COLONOSCOPY  Never done  . PAP SMEAR-Modifier  07/14/2019    There are no preventive care reminders to display for this patient.  Lab Results  Component Value Date   TSH 0.746 10/23/2006   Lab Results  Component Value Date   WBC 4.4 07/27/2018   HGB 12.8 07/27/2018   HCT 37.5 07/27/2018   MCV 90.1 07/27/2018   PLT 262 07/27/2018   Lab Results  Component Value Date   NA 136 07/27/2018   K 3.8 07/27/2018   CO2 22 07/27/2018   GLUCOSE 95 07/27/2018   BUN 12 07/27/2018   CREATININE 0.82 07/27/2018   BILITOT 0.4 07/27/2018   AST 20 07/27/2018   ALT 14 07/27/2018   PROT 6.7 07/27/2018   CALCIUM 9.1 07/27/2018   Lab Results  Component Value Date   CHOL 186 07/27/2018   Lab Results  Component Value Date   HDL 68  07/27/2018  Lab Results  Component Value Date   LDLCALC 104 (H) 07/27/2018   Lab Results  Component Value Date   TRIG 55 07/27/2018   Lab Results  Component Value Date   CHOLHDL 2.7 07/27/2018   No results found for: HGBA1C    Assessment & Plan:   Problem List Items Addressed This Visit    None    Visit Diagnoses    Mild persistent asthma with exacerbation    -  Primary   Relevant Medications   predniSONE (DELTASONE) 20 MG tablet   chlorpheniramine-HYDROcodone (TUSSIONEX) 10-8 MG/5ML SUER   Acute upper respiratory infection       Relevant Medications   chlorpheniramine-HYDROcodone (TUSSIONEX) 10-8 MG/5ML SUER     Mild persistent asthma with exacerbation-discussed treatment with prednisone and she also requested some cough medicine for bedtime.  Do not see anything bacterial on exam that warrants further treatment with an antibiotic.  But certainly she is not improving after the weekend and please give [place] a call back.  Upper respiratory infection-recommended that she do PCR testing for COVID-19 and offered to do the test here in the office today but she declined.   Meds ordered this encounter  Medications  . predniSONE (DELTASONE) 20 MG tablet    Sig: Take 2 tablets (40 mg total) by mouth daily for 5 days, THEN 1 tablet (20 mg total) daily for 3 days.    Dispense:  13 tablet    Refill:  0  . chlorpheniramine-HYDROcodone (TUSSIONEX) 10-8 MG/5ML SUER    Sig: Take 5 mLs by mouth every 12 (twelve) hours as needed for up to 5 days for cough.    Dispense:  50 mL    Refill:  0    Follow-up: Return if symptoms worsen or fail to improve.    Beatrice Lecher, MD

## 2019-07-16 NOTE — Patient Instructions (Signed)
Bronchospasm, Adult  Bronchospasm is when airways in the lungs get smaller. When this happens, it can be hard to breathe. You may cough. You may also make a whistling sound when you breathe (wheeze). Follow these instructions at home: Medicines  Take over-the-counter and prescription medicines only as told by your doctor.  If you need to use an inhaler or nebulizer to take your medicine, ask your doctor how to use it.  If you were given a spacer, always use it with your inhaler. Lifestyle  Change your heating and air conditioning filter. Do this at least once a month.  Try not to use fireplaces and wood stoves.  Do not  smoke. Do not  allow smoking in your home.  Try not to use things that have a strong smell, like perfume.  Get rid of pests (such as roaches and mice) and their poop.  Remove any mold from your home.  Keep your house clean. Get rid of dust.  Use cleaning products that have no smell.  Replace carpet with wood, tile, or vinyl flooring.  Use allergy-proof pillows, mattress covers, and box spring covers.  Wash bed sheets and blankets every week. Use hot water. Dry them in a dryer.  Use blankets that are made of polyester or cotton.  Wash your hands often.  Keep pets out of your bedroom.  When you exercise, try not to breathe in cold air. General instructions  Have a plan for getting medical care. Know these things: ? When to call your doctor. ? When to call local emergency services (911 in the [place] ? Where to go in an emergency.  Stay up to date on your shots (immunizations).  When you have an episode: ? Stay calm. ? Relax. ? Breathe slowly. Contact a doctor if:  Your muscles ache.  Your chest hurts.  The color of the mucus you cough up (sputum) changes from clear or white to yellow, green, gray, or bloody.  The mucus you cough up gets thicker.  You have a fever. Get help right away if:  The whistling sound gets worse, even after you  take your medicines.  Your coughing gets worse.  You find it even harder to breathe.  Your chest hurts very much. Summary  Bronchospasm is when airways in the lungs get smaller.  When this happens, it can be hard to breathe. You may cough. You may also make a whistling sound when you breathe.  Stay away from things that cause you to have episodes. These include smoke or dust. This information is not intended to replace advice given to you by your health care provider. Make sure you discuss any questions you have with your health care provider. Document Revised: 12/20/2016 Document Reviewed: 01/11/2016 Elsevier Patient Education  2020 Elsevier Inc.  

## 2019-07-23 ENCOUNTER — Telehealth (INDEPENDENT_AMBULATORY_CARE_PROVIDER_SITE_OTHER): Payer: 59 | Admitting: Nurse Practitioner

## 2019-07-23 ENCOUNTER — Encounter: Payer: Self-pay | Admitting: Nurse Practitioner

## 2019-07-23 VITALS — BP 121/82 | HR 67

## 2019-07-23 DIAGNOSIS — J019 Acute sinusitis, unspecified: Secondary | ICD-10-CM

## 2019-07-23 DIAGNOSIS — B9689 Other specified bacterial agents as the cause of diseases classified elsewhere: Secondary | ICD-10-CM

## 2019-07-23 MED ORDER — LEVOFLOXACIN 500 MG PO TABS: 500.0000 mg | ORAL_TABLET | Freq: Every day | ORAL | 0 refills | Status: DC

## 2019-07-23 NOTE — Patient Instructions (Signed)
The following information is provided as a general resource for ADULT patients only and does NOT take into account PREGNANCY, ALLERGIES, LIVER CONDITIONS, KIDNEY CONDITIONS, GASTROINTESTINAL CONDITIONS, OR PRESCRIPTION MEDICATION INTERACTIONS. Please be sure to ask your provider if the following are safe to take with your specific medical history, conditions, or current medication regimen if you are unsure.   Adult Basic Symptom Management for Sinusitis  Congestion: Guaifenesin (Mucinex)- follow directions on packaging with a maximum dose of 2400mg in a 24 hour period.  Pain/Fever: Ibuprofen 200mg - 400mg every 4-6 hours as needed (MAX 1200mg in a 24 hour period) Pain/Fever: Tylenol 500mg -1000mg every 6-8 hours as needed (MAX 3000mg in a 24 hour period)  Cough: Dextromethorphan (Delsym)- follow directions on packing with a maximum dose of 120mg in a 24 hour period.  Nasal Stuffiness: Saline nasal spray and/or Nettie Pot with sterile saline solution  Runny Nose: Fluticasone nasal spray (Flonase) OR Mometasone nasal spray (Nasonex) OR Triamcinolone Acetonide nasal spray (Nasacort)- follow directions on the packaging  Pain/Pressure: Warm washcloth to the face  Sore Throat: Warm salt water gargles  If you have allergies, you may also consider taking an oral antihistamine (like Zyrtec or Claritin) as these may also help with your symptoms.  **Many medications will have more than one ingredient, be sure you are reading the packaging carefully and not taking more than one dose of the same kind of medication at the same time or too close together. It is OK to use formulas that have all of the ingredients you want, but do not take them in a combined medication and as separate dose too close together. If you have any questions, the pharmacist will be happy to help you decide what is safe.    

## 2019-07-23 NOTE — Progress Notes (Addendum)
Virtual Video Visit via MyChart Note  I connected with  Adrienne Lara on 07/23/19 at  9:50 AM EDT by the video enabled telemedicine application for , MyChart, and verified that I am speaking with the correct person using two identifiers.   I introduced myself as a Designer, jewellery with the practice. We discussed the limitations of evaluation and management by telemedicine and the availability of in person appointments. The patient expressed understanding and agreed to proceed.  The patient is: at home I am: in the office  Subjective:    CC:  Chief Complaint  Patient presents with  . Sinusitis    onset:seen last Fri with Dr. Madilyn Fireman, got Rx's that have helped with the cough but now has sinus sx, red eyes, fatigue, productive cough of yellow mucus, sinus congestion,  facial pain/pressure, eye pressure, itching nose, watery eyes, ear fullness, runny nose, PND, ST, have been taking Zyrtec-D with minimal relief    HPI: Adrienne Lara is a 54 y.o. y/o female presenting via [place] today for symptoms of acute sinusitis. She reports last week she developed a cough and was treated for bronchitis with steroids and cough medication. She reports her symptoms of cough were improving initially, but for the last several days they have increasingly gotten worse and she is now experiencing sinus pain and pressure, purulent nasal drainage, sore throat, fatigue, and post nasal drip. Her cough has also become productive now. She finished the steroid and has also been taking Zyrtec-D with minimal relief. She also reports her left eye is red and irritated, but denies purulent discharge, pain, or vision changes.   She denies shortness of breath, dizziness, fever, chills, nausea, vomiting, or diarrhea.   Past medical history, Surgical history, Family history not pertinant except as noted below, Social history, Allergies, and medications have been entered into the medical record, reviewed, and corrections  made.   Review of Systems:  See HPI for pertinent positive and negatives  Objective:    General: Speaking clearly in complete sentences without any shortness of breath.   Alert and oriented x3.   Normal judgment.  No apparent acute distress.   Impression and Recommendations:    1. Acute bacterial sinusitis Symptoms and presentation consistent with acute bacterial sinusitis following viral bronchitis. She does have an allergy to penicillin and sulfa drugs, and is unable to tolerate doxycycline due to gastrointestinal upset. We will utilize levofloxacin for 5 days which should help both the sinus infection and any infection that may be present in the lungs. Discussed over the counter treatments that may help with symptom management.   PLAN: - Levofloxacin 500mg  daily for 5 days.  - May use probiotic to help prevent gastrointestinal upset and vaginal yeast infection.  - Follow-up if symptoms worsen or fail to improve.   - levofloxacin (LEVAQUIN) 500 MG tablet; Take 1 tablet (500 mg total) by mouth daily.  Dispense: 5 tablet; Refill: 0      I discussed the assessment and treatment plan with the patient. The patient was provided an opportunity to ask questions and all were answered. The patient agreed with the plan and demonstrated an understanding of the instructions.   The patient was advised to call back or seek an in-person evaluation if the symptoms worsen or if the condition fails to improve as anticipated.  I provided 20 minutes of non-face-to-face interaction with this [place] visit including intake, same-day documentation, and chart review.   Orma Render, NP

## 2019-08-26 ENCOUNTER — Other Ambulatory Visit: Payer: Self-pay | Admitting: Physician Assistant

## 2019-08-26 DIAGNOSIS — G43109 Migraine with aura, not intractable, without status migrainosus: Secondary | ICD-10-CM

## 2019-08-26 NOTE — Telephone Encounter (Signed)
MyChart request. Sending to assistant to address

## 2019-10-26 ENCOUNTER — Other Ambulatory Visit: Payer: Self-pay | Admitting: Osteopathic Medicine

## 2019-10-26 DIAGNOSIS — F419 Anxiety disorder, unspecified: Secondary | ICD-10-CM

## 2019-10-26 NOTE — Telephone Encounter (Signed)
Last refill 02/24/19 Last Ov- 02/24/2019

## 2019-11-29 ENCOUNTER — Other Ambulatory Visit: Payer: Self-pay | Admitting: Physician Assistant

## 2019-11-29 DIAGNOSIS — G43109 Migraine with aura, not intractable, without status migrainosus: Secondary | ICD-10-CM

## 2019-12-03 ENCOUNTER — Telehealth: Payer: 59 | Admitting: Physician Assistant

## 2019-12-03 DIAGNOSIS — J069 Acute upper respiratory infection, unspecified: Secondary | ICD-10-CM | POA: Diagnosis not present

## 2019-12-03 MED ORDER — PREDNISONE 10 MG (21) PO TBPK: ORAL_TABLET | Freq: Every day | ORAL | 0 refills | Status: DC

## 2019-12-03 MED ORDER — BENZONATATE 100 MG PO CAPS: 100.0000 mg | ORAL_CAPSULE | Freq: Three times a day (TID) | ORAL | 0 refills | Status: AC

## 2019-12-03 NOTE — Progress Notes (Signed)
We are sorry that you are not feeling well.  Here is how we plan to help!  Based on your presentation I believe you most likely have A cough due to a virus.  This is called viral bronchitis and is best treated by rest, plenty of fluids and control of the cough.  You may use Ibuprofen or Tylenol as directed to help your symptoms.     In addition you may use A prescription cough medication called Tessalon Perles 100mg . You may take 1-2 capsules every 8 hours as needed for your cough.  I have prescribed a Prednisone taper as well.   You indicated on your questionnaire that you are having chest pain. If you are having chest pain only when you cough then that is more reassuring, but if you have constant chest pain, chest pain when your breath, or any shortness of breath you will need to be seen for an in person visit.   From your responses in the eVisit questionnaire you describe inflammation in the upper respiratory tract which is causing a significant cough.  This is commonly called Bronchitis and has four common causes:    Allergies  Viral Infections  Acid Reflux  Bacterial Infection Allergies, viruses and acid reflux are treated by controlling symptoms or eliminating the cause. An example might be a cough caused by taking certain blood pressure medications. You stop the cough by changing the medication. Another example might be a cough caused by acid reflux. Controlling the reflux helps control the cough.  USE OF BRONCHODILATOR ("RESCUE") INHALERS: There is a risk from using your bronchodilator too frequently.  The risk is that over-reliance on a medication which only relaxes the muscles surrounding the breathing tubes can reduce the effectiveness of medications prescribed to reduce swelling and congestion of the tubes themselves.  Although you feel brief relief from the bronchodilator inhaler, your asthma may actually be worsening with the tubes becoming more swollen and filled with mucus.   This can delay other crucial treatments, such as oral steroid medications. If you need to use a bronchodilator inhaler daily, several times per day, you should discuss this with your provider.  There are probably better treatments that could be used to keep your asthma under control.     HOME CARE . Only take medications as instructed by your medical team. . Complete the entire course of an antibiotic. . Drink plenty of fluids and get plenty of rest. . Avoid close contacts especially the very young and the elderly . Cover your mouth if you cough or cough into your sleeve. . Always remember to wash your hands . A steam or ultrasonic humidifier can help congestion.   GET HELP RIGHT AWAY IF: . You develop worsening fever. . You become short of breath . You cough up blood. . Your symptoms persist after you have completed your treatment plan MAKE SURE YOU   Understand these instructions.  Will watch your condition.  Will get help right away if you are not doing well or get worse.  Your e-visit answers were reviewed by a board certified advanced clinical practitioner to complete your personal care plan.  Depending on the condition, your plan could have included both over the counter or prescription medications. If there is a problem please reply  once you have received a response from your provider. Your safety is important to [place].  If you have drug allergies check your prescription carefully.    You can use MyChart to ask questions  about today's visit, request a non-urgent call back, or ask for a work or school excuse for 24 hours related to this e-Visit. If it has been greater than 24 hours you will need to follow up with your provider, or enter a new e-Visit to address those concerns. You will get an e-mail in the next two days asking about your experience.  I hope that your e-visit has been valuable and will speed your recovery. Thank you for using e-visits.  Approximately 5 minutes was  spent documenting and reviewing patient's chart.

## 2020-01-26 ENCOUNTER — Encounter: Payer: Self-pay | Admitting: Physician Assistant

## 2020-01-26 ENCOUNTER — Other Ambulatory Visit: Payer: Self-pay | Admitting: Physician Assistant

## 2020-01-26 DIAGNOSIS — F419 Anxiety disorder, unspecified: Secondary | ICD-10-CM

## 2020-09-29 ENCOUNTER — Encounter: Payer: Self-pay | Admitting: Physician Assistant

## 2020-09-29 ENCOUNTER — Telehealth: Payer: 59 | Admitting: Nurse Practitioner

## 2020-09-29 DIAGNOSIS — H60331 Swimmer's ear, right ear: Secondary | ICD-10-CM

## 2020-09-29 MED ORDER — CIPROFLOXACIN-DEXAMETHASONE 0.3-0.1 % OT SUSP: 4.0000 [drp] | Freq: Two times a day (BID) | OTIC | 0 refills | Status: DC

## 2020-09-29 MED ORDER — OFLOXACIN 0.3 % OT SOLN: 5.0000 [drp] | Freq: Every day | OTIC | 0 refills | Status: DC

## 2020-09-29 NOTE — Progress Notes (Signed)
E Visit for Swimmer's Ear  We are sorry that you are not feeling well. Here is how we plan to help!  I have prescribed: olfloxin  otic suspension 3 drops in affected ears twice daily for 7 days    In certain cases swimmer's ear may progress to a more serious bacterial infection of the middle or inner ear.  If you have a fever 102 and up and significantly worsening symptoms, this could indicate a more serious infection moving to the middle/inner and needs face to face evaluation in an office by a provider.  Your symptoms should improve over the next 3 days and should resolve in about 7 days.  HOME CARE:  Wash your hands frequently. Do not place the tip of the bottle on your ear or touch it with your fingers. You can take Acetominophen 650 mg every 4-6 hours as needed for pain.  If pain is severe or moderate, you can apply a heating pad (set on low) or hot water bottle (wrapped in a towel) to outer ear for 20 minutes.  This will also increase drainage. Avoid ear plugs Do not use Q-tips After showers, help the water run out by tilting your head to one side.  GET HELP RIGHT AWAY IF:  Fever is over 102.2 degrees. You develop progressive ear pain or hearing loss. Ear symptoms persist longer than 3 days after treatment.  MAKE SURE YOU:  Understand these instructions. Will watch your condition. Will get help right away if you are not doing well or get worse.  TO PREVENT SWIMMER'S EAR: Use a bathing cap or custom fitted swim molds to keep your ears dry. Towel off after swimming to dry your ears. Tilt your head or pull your earlobes to allow the water to escape your ear canal. If there is still water in your ears, consider using a hairdryer on the lowest setting.   Thank you for choosing an e-visit.  Your e-visit answers were reviewed by a board certified advanced clinical practitioner to complete your personal care plan. Depending upon the condition, your plan could have included  both over the counter or prescription medications.  Please review your pharmacy choice. Make sure the pharmacy is open so you can pick up prescription now. If there is a problem, you may contact your provider through [employer] and have the prescription routed to another pharmacy.  Your safety is important to [place]. If you have drug allergies check your prescription carefully.   For the next 24 hours you can use MyChart to ask questions about today's visit, request a non-urgent call back, or ask for a work or school excuse. You will get an email in the next two days asking about your experience. I hope that your e-visit has been valuable and will speed your recovery.  5-10 minutes spent reviewing and documenting in chart.

## 2020-09-29 NOTE — Addendum Note (Signed)
Addended by: Chevis Pretty on: 09/29/2020 11:42 AM   Modules accepted: Orders

## 2020-10-25 ENCOUNTER — Encounter: Payer: 59 | Admitting: Physician Assistant

## 2021-03-08 ENCOUNTER — Other Ambulatory Visit: Payer: Self-pay | Admitting: Physician Assistant

## 2021-03-08 DIAGNOSIS — F419 Anxiety disorder, unspecified: Secondary | ICD-10-CM

## 2021-03-23 ENCOUNTER — Ambulatory Visit (INDEPENDENT_AMBULATORY_CARE_PROVIDER_SITE_OTHER): Payer: 59 | Admitting: Physician Assistant

## 2021-03-23 ENCOUNTER — Encounter: Payer: Self-pay | Admitting: Physician Assistant

## 2021-03-23 ENCOUNTER — Other Ambulatory Visit: Payer: Self-pay

## 2021-03-23 VITALS — BP 155/85 | HR 90 | Ht 64.0 in | Wt 163.0 lb

## 2021-03-23 DIAGNOSIS — Z0000 Encounter for general adult medical examination without abnormal findings: Secondary | ICD-10-CM

## 2021-03-23 DIAGNOSIS — Z1231 Encounter for screening mammogram for malignant neoplasm of breast: Secondary | ICD-10-CM

## 2021-03-23 DIAGNOSIS — F411 Generalized anxiety disorder: Secondary | ICD-10-CM

## 2021-03-23 DIAGNOSIS — R232 Flushing: Secondary | ICD-10-CM

## 2021-03-23 DIAGNOSIS — Z1211 Encounter for screening for malignant neoplasm of colon: Secondary | ICD-10-CM | POA: Diagnosis not present

## 2021-03-23 DIAGNOSIS — R030 Elevated blood-pressure reading, without diagnosis of hypertension: Secondary | ICD-10-CM | POA: Insufficient documentation

## 2021-03-23 DIAGNOSIS — Z131 Encounter for screening for diabetes mellitus: Secondary | ICD-10-CM

## 2021-03-23 DIAGNOSIS — Z13220 Encounter for screening for lipoid disorders: Secondary | ICD-10-CM

## 2021-03-23 MED ORDER — DULOXETINE HCL 20 MG PO CPEP: 20.0000 mg | ORAL_CAPSULE | Freq: Every day | ORAL | 3 refills | Status: DC

## 2021-03-23 NOTE — Patient Instructions (Addendum)

## 2021-03-23 NOTE — Progress Notes (Signed)
Subjective:  ?  ? Adrienne Lara is a 56 y.o. female and is here for a comprehensive physical exam. The patient reports problems - she is having hot flashes, increase in anxiety  and wonders about her hormones. It has been 5 years since her last mirena. She is not bleeding.  ? ?Social History  ? ?Socioeconomic History  ? Marital status: Married  ?  Spouse name: Not on file  ? Number of children: Not on file  ? Years of education: Not on file  ? Highest education level: Not on file  ?Occupational History  ? Not on file  ?Tobacco Use  ? Smoking status: Former  ? Smokeless tobacco: Never  ?Substance and Sexual Activity  ? Alcohol use: Yes  ?  Alcohol/week: 7.0 standard drinks  ?  Types: 7 Standard drinks or equivalent per week  ? Drug use: Never  ? Sexual activity: Not Currently  ?  Birth control/protection: I.U.D.  ?Other Topics Concern  ? Not on file  ?Social History Narrative  ? Not on file  ? ?Social Determinants of Health  ? ?Financial Resource Strain: Not on file  ?Food Insecurity: Not on file  ?Transportation Needs: Not on file  ?Physical Activity: Not on file  ?Stress: Not on file  ?Social Connections: Not on file  ?Intimate Partner Violence: Not on file  ? ?Health Maintenance  ?Topic Date Due  ? HIV Screening  Never done  ? Hepatitis C Screening  Never done  ? COLONOSCOPY (Pts 45-69yrs Insurance coverage will need to be confirmed)  Never done  ? MAMMOGRAM  09/18/2019  ? COVID-19 Vaccine (1) 04/08/2021 (Originally 01/12/1966)  ? INFLUENZA VACCINE  04/20/2021 (Originally 08/21/2020)  ? Zoster Vaccines- Shingrix (1 of 2) 06/23/2021 (Originally 07/13/1984)  ? PAP SMEAR-Modifier  07/13/2021  ? TETANUS/TDAP  05/01/2025  ? HPV VACCINES  Aged Out  ? ? ?The following portions of the patient's history were reviewed and updated as appropriate: allergies, current medications, past family history, past medical history, past social history, past surgical history, and problem list. ? ?Review of Systems ?A comprehensive  review of systems was negative.  ? ?Objective:  ? ? BP (!) 155/85   Pulse 90   Ht 5\' 4"  (1.626 m)   Wt 163 lb (73.9 kg)   SpO2 100%   BMI 27.98 kg/m?  ?General appearance: alert, cooperative, and appears stated age ?Head: Normocephalic, without obvious abnormality, atraumatic ?Eyes: conjunctivae/corneas clear. PERRL, EOM's intact. Fundi benign. ?Ears: normal TM's and external ear canals both ears ?Nose: Nares normal. Septum midline. Mucosa normal. No drainage or sinus tenderness. ?Throat: lips, mucosa, and tongue normal; teeth and gums normal ?Neck: no adenopathy, no carotid bruit, no JVD, supple, symmetrical, trachea midline, and thyroid not enlarged, symmetric, no tenderness/mass/nodules ?Back: symmetric, no curvature. ROM normal. No CVA tenderness. ?Lungs: clear to auscultation bilaterally ?Heart: regular rate and rhythm, S1, S2 normal, no murmur, click, rub or gallop ?Abdomen: soft, non-tender; bowel sounds normal; no masses,  no organomegaly ?Extremities: extremities normal, atraumatic, no cyanosis or edema ?Pulses: 2+ and symmetric ?Skin: Skin color, texture, turgor normal. No rashes or lesions ?Lymph nodes: Cervical, supraclavicular, and axillary nodes normal. ?Neurologic: Alert and oriented X 3, normal strength and tone. Normal symmetric reflexes. Normal coordination and gait  ? Marland KitchenMarland Kitchen ?Depression screen [facility] 2/9 03/23/2021 02/24/2019 12/15/2018 09/23/2017 08/25/2017  ?Decreased Interest 0 0 0 0 0  ?Down, Depressed, Hopeless 1 0 1 0 0  ?PHQ - 2 Score 1 0 1 0 0  ?  Altered sleeping - 0 0 1 0  ?Tired, decreased energy - 1 0 0 0  ?Change in appetite - 0 0 0 0  ?Feeling bad or failure about yourself  - 0 0 1 2  ?Trouble concentrating - 0 1 0 0  ?Moving slowly or fidgety/restless - 0 0 0 0  ?Suicidal thoughts - 0 0 0 0  ?PHQ-9 Score - 1 2 2 2   ?Difficult doing work/chores - Not difficult at all Not difficult at all Not difficult at all Somewhat difficult  ? ? ? ?Assessment:  ? ? Healthy female exam.   ?  ?Plan:  ? ?  Marland KitchenBenjamine Mola was seen today for annual exam. ? ?Diagnoses and all orders for this visit: ? ?Routine physical examination ?-     Cologuard ?-     Lipid Panel w/reflex Direct LDL ?-     COMPLETE METABOLIC PANEL WITH GFR ?-     TSH ?-     VITAMIN D 25 Hydroxy (Vit-D Deficiency, Fractures) ?-     B12 and Folate Panel ?-     FSH/LH ?-     Estradiol ?-     Hepatitis C Antibody ? ?Encounter for screening mammogram for malignant neoplasm of breast ?-     MM 3D SCREEN BREAST BILATERAL ? ?Colon cancer screening ?-     Cologuard ? ?Screening for diabetes mellitus ?-     COMPLETE METABOLIC PANEL WITH GFR ? ?Screening for lipid disorders ?-     Lipid Panel w/reflex Direct LDL ? ?GAD (generalized anxiety disorder) ?-     DULoxetine (CYMBALTA) 20 MG capsule; Take 1 capsule (20 mg total) by mouth daily. ? ?Hot flashes ?-     FSH/LH ?-     Estradiol ? ?Elevated blood pressure reading ? ? ?Discussed 150 minutes of exercise a week.  ?Encouraged vitamin D 1000 units and Calcium 1300mg  or 4 servings of dairy a day.  ?PHQ/GAD numbers not to goal ?Restart cymbalta ?Follow up in 4-6 weeks ?BP elevated, start checking at home.  ?Labs ordered.  ?Needs pap/mammogram.  ?Mammogram ordered.  ?Pap will have done at GYN. ?Needs mirena removed. ?Cologuard ordered. No family or personal hx of colon cancer. ?Declines covid/shingles/flu vaccine. ? ? ? ?See After Visit Summary for Counseling Recommendations  ? ?

## 2021-03-29 LAB — COMPLETE METABOLIC PANEL WITH GFR
AG Ratio: 1.7 (calc) (ref 1.0–2.5)
ALT: 12 U/L (ref 6–29)
AST: 17 U/L (ref 10–35)
Albumin: 4.5 g/dL (ref 3.6–5.1)
Alkaline phosphatase (APISO): 49 U/L (ref 37–153)
BUN: 15 mg/dL (ref 7–25)
CO2: 27 mmol/L (ref 20–32)
Calcium: 9.6 mg/dL (ref 8.6–10.4)
Chloride: 103 mmol/L (ref 98–110)
Creat: 0.88 mg/dL (ref 0.50–1.03)
Globulin: 2.6 g/dL (calc) (ref 1.9–3.7)
Glucose, Bld: 95 mg/dL (ref 65–99)
Potassium: 4.4 mmol/L (ref 3.5–5.3)
Sodium: 139 mmol/L (ref 135–146)
Total Bilirubin: 0.8 mg/dL (ref 0.2–1.2)
Total Protein: 7.1 g/dL (ref 6.1–8.1)
eGFR: 78 mL/min/{1.73_m2} (ref >=60)

## 2021-03-29 LAB — FSH/LH
FSH: 40.1 m[IU]/mL
LH: 37.9 m[IU]/mL

## 2021-03-29 LAB — LIPID PANEL W/REFLEX DIRECT LDL
Cholesterol: 245 mg/dL — ABNORMAL HIGH (ref <200)
HDL: 67 mg/dL (ref >=50)
LDL Cholesterol (Calc): 163 mg/dL (calc) — ABNORMAL HIGH
Non-HDL Cholesterol (Calc): 178 mg/dL (calc) — ABNORMAL HIGH (ref <130)
Total CHOL/HDL Ratio: 3.7 (calc) (ref <5.0)
Triglycerides: 55 mg/dL (ref <150)

## 2021-03-29 LAB — B12 AND FOLATE PANEL
Folate: 14.7 ng/mL
Vitamin B-12: 644 pg/mL (ref 200–1100)

## 2021-03-29 LAB — HEPATITIS C ANTIBODY
Hepatitis C Ab: NONREACTIVE
SIGNAL TO CUT-OFF: <0.02 (ref <1.00)

## 2021-03-29 LAB — ESTRADIOL: Estradiol: 97 pg/mL

## 2021-03-29 LAB — TSH: TSH: 2.02 mIU/L

## 2021-03-29 LAB — VITAMIN D 25 HYDROXY (VIT D DEFICIENCY, FRACTURES): Vit D, 25-Hydroxy: 40 ng/mL (ref 30–100)

## 2021-03-30 ENCOUNTER — Encounter: Payer: Self-pay | Admitting: Physician Assistant

## 2021-03-30 NOTE — Progress Notes (Signed)
Hi Adrienne Lara, your cholesterol is way up compared to 2 years ago.  I would recommend work on healthy diet and exercise to see if we can get this back down to where you were 2 years ago . Your thyroid, metabolic panel and vitamin are good. B12 and folate are good.  Negative for Hepatitis C. HOormones look like she is post menopausal so I would recommend having your mirena removed. Plan to recheck estradiol in 1 months.  ? ?The 10-year ASCVD risk score (Arnett DK, et al., 2019) is: 3% ?  Values used to calculate the score: ?    Age: 56 years ?    Sex: Female ?    Is Non-Hispanic African American: No ?    Diabetic: No ?    Tobacco smoker: No ?    Systolic Blood Pressure: 829 mmHg ?    Is BP treated: No ?    HDL Cholesterol: 67 mg/dL ?    Total Cholesterol: 245 mg/dL ?

## 2021-04-05 ENCOUNTER — Other Ambulatory Visit: Payer: Self-pay | Admitting: Physician Assistant

## 2021-04-26 ENCOUNTER — Ambulatory Visit (INDEPENDENT_AMBULATORY_CARE_PROVIDER_SITE_OTHER): Payer: 59

## 2021-04-26 DIAGNOSIS — Z1231 Encounter for screening mammogram for malignant neoplasm of breast: Secondary | ICD-10-CM

## 2021-04-26 NOTE — Progress Notes (Signed)
Normal mammogram. Follow up in one year.

## 2021-05-22 ENCOUNTER — Encounter: Payer: Self-pay | Admitting: Physician Assistant

## 2021-07-06 ENCOUNTER — Telehealth: Payer: 59 | Admitting: Physician Assistant

## 2021-07-06 DIAGNOSIS — H60392 Other infective otitis externa, left ear: Secondary | ICD-10-CM | POA: Diagnosis not present

## 2021-07-06 MED ORDER — NEOMYCIN-POLYMYXIN-HC 3.5-10000-1 OT SOLN: 3.0000 [drp] | Freq: Four times a day (QID) | OTIC | 0 refills | Status: DC

## 2021-07-06 NOTE — Progress Notes (Signed)

## 2021-07-20 ENCOUNTER — Telehealth: Payer: 59 | Admitting: Family Medicine

## 2021-07-20 DIAGNOSIS — J019 Acute sinusitis, unspecified: Secondary | ICD-10-CM | POA: Diagnosis not present

## 2021-07-20 DIAGNOSIS — B9689 Other specified bacterial agents as the cause of diseases classified elsewhere: Secondary | ICD-10-CM | POA: Diagnosis not present

## 2021-07-20 MED ORDER — DOXYCYCLINE HYCLATE 100 MG PO TABS: 100.0000 mg | ORAL_TABLET | Freq: Two times a day (BID) | ORAL | 0 refills | Status: AC

## 2021-07-20 NOTE — Progress Notes (Signed)

## 2021-08-22 ENCOUNTER — Encounter: Payer: Self-pay | Admitting: Neurology

## 2021-09-11 ENCOUNTER — Ambulatory Visit: Payer: 59 | Admitting: Physician Assistant

## 2021-09-11 VITALS — BP 157/88 | HR 79 | Ht 63.0 in | Wt 158.0 lb

## 2021-09-11 DIAGNOSIS — J3089 Other allergic rhinitis: Secondary | ICD-10-CM | POA: Diagnosis not present

## 2021-09-11 DIAGNOSIS — J0140 Acute pansinusitis, unspecified: Secondary | ICD-10-CM | POA: Diagnosis not present

## 2021-09-11 DIAGNOSIS — H9313 Tinnitus, bilateral: Secondary | ICD-10-CM | POA: Diagnosis not present

## 2021-09-11 DIAGNOSIS — E7800 Pure hypercholesterolemia, unspecified: Secondary | ICD-10-CM | POA: Insufficient documentation

## 2021-09-11 DIAGNOSIS — R030 Elevated blood-pressure reading, without diagnosis of hypertension: Secondary | ICD-10-CM | POA: Diagnosis not present

## 2021-09-11 MED ORDER — METHYLPREDNISOLONE 4 MG PO TBPK: ORAL_TABLET | ORAL | 0 refills | Status: DC

## 2021-09-11 MED ORDER — AZITHROMYCIN 250 MG PO TABS: ORAL_TABLET | ORAL | 0 refills | Status: DC

## 2021-09-11 NOTE — Progress Notes (Signed)
Established Patient Office Visit  Subjective   Patient ID: Adrienne Lara, female    DOB: February 03, 1965  Age: 56 y.o. MRN: [id number]  Chief Complaint  Patient presents with   Follow-up    HPI Pt is a 56 yo female with Migraines, Asthma, HLD and elevated blood pressure readings. She checks her BP at home and around 130s over 80s. No CP, palpitations, headaches, or vision changes. She does have a lot of sinus pressure and congestion. She has ringing in both ears.  Hx of allergies. She is most concerned about the pulsitle blood pressure sensation in both ears.  .. Active Ambulatory Problems    Diagnosis Date Noted   NEVUS, ATYPICAL 11/10/2006   HYPERLIPIDEMIA 12/26/2005   DISORDER, DYSTHYMIC 12/26/2005   SINUSITIS, ACUTE NOS 10/23/2006   Perennial allergic rhinitis 12/23/2005   EXCESSIVE MENSTRUATION 10/23/2006   [place] DISEASE, CERVICAL 12/30/2006   NECK PAIN 12/23/2005   PAIN IN THORACIC SPINE 12/26/2005   MYALGIA 10/23/2006   HEADACHE 12/23/2005   IUD (intrauterine device) in place 08/16/2015   Mild intermittent asthma, uncomplicated 53/29/9242   Secondary amenorrhea 08/25/2017   Breast cancer screening, high risk patient 08/25/2017   Family history of breast cancer in mother 08/25/2017   White coat syndrome without diagnosis of hypertension 08/25/2017   Abnormal weight gain 08/25/2017   Migraine with aura and without status migrainosus, not intractable 08/25/2017   GAD (generalized anxiety disorder) 08/25/2017   Overweight with body mass index (BMI) 25.0-29.9 09/23/2017   Reactive airway disease 01/23/2018   Encounter for long-term (current) use of medications 01/23/2018   Chronic left-sided low back pain with bilateral sciatica 07/23/2018   Chronic left hip pain 07/23/2018   Lipoma of left thigh 07/23/2018   Environmental allergies 02/24/2019   Hot flashes 03/23/2021   Elevated blood pressure reading 03/23/2021   Tinnitus of both ears 09/11/2021   Elevated LDL  cholesterol level 09/11/2021   Resolved Ambulatory Problems    Diagnosis Date Noted   ANXIETY 12/23/2005   Colon cancer screening 01/23/2018   Past Medical History:  Diagnosis Date   Allergy    Anxiety    Asthma    Heart murmur      ROS See HPI.    Objective:     BP (!) 157/88   Pulse 79   Ht '5\' 3"'$  (1.6 m)   Wt 158 lb (71.7 kg)   SpO2 99%   BMI 27.99 kg/m  BP Readings from Last 3 Encounters:  09/11/21 (!) 157/88  03/23/21 (!) 155/85  07/23/19 121/82   Wt Readings from Last 3 Encounters:  09/11/21 158 lb (71.7 kg)  03/23/21 163 lb (73.9 kg)  07/16/19 160 lb (72.6 kg)      Physical Exam Constitutional:      Appearance: Normal appearance.  HENT:     Head: Normocephalic.     Right Ear: Tympanic membrane, ear canal and external ear normal. There is no impacted cerumen.     Left Ear: Tympanic membrane, ear canal and external ear normal. There is no impacted cerumen.     Mouth/Throat:     Mouth: Mucous membranes are moist.     Pharynx: No posterior oropharyngeal erythema.  Eyes:     Extraocular Movements: Extraocular movements intact.     Conjunctiva/sclera: Conjunctivae normal.     Pupils: Pupils are equal, round, and reactive to light.  Neck:     Vascular: No carotid bruit.  Cardiovascular:     Rate and  Rhythm: Normal rate and regular rhythm.     Pulses: Normal pulses.     Heart sounds: Murmur heard.  Pulmonary:     Effort: Pulmonary effort is normal.     Breath sounds: Normal breath sounds.  Musculoskeletal:     Cervical back: Normal range of motion and neck supple. No rigidity or tenderness.  Neurological:     General: No focal deficit present.     Mental Status: She is alert and oriented to person, place, and time.  Psychiatric:        Mood and Affect: Mood normal.         Assessment & Plan:  Marland KitchenMarland KitchenJodene was seen today for follow-up.  Diagnoses and all orders for this visit:  Acute non-recurrent pansinusitis -     COMPLETE METABOLIC  PANEL WITH GFR -     azithromycin (ZITHROMAX Z-PAK) 250 MG tablet; Take 2 tablets (500 mg) on  Day 1,  followed by 1 tablet (250 mg) once daily on Days 2 through 5. -     methylPREDNISolone (MEDROL DOSEPAK) 4 MG TBPK tablet; 6-day pack as directed  White coat syndrome without diagnosis of hypertension -     COMPLETE METABOLIC PANEL WITH GFR  Tinnitus of both ears -     COMPLETE METABOLIC PANEL WITH GFR -     azithromycin (ZITHROMAX Z-PAK) 250 MG tablet; Take 2 tablets (500 mg) on  Day 1,  followed by 1 tablet (250 mg) once daily on Days 2 through 5. -     methylPREDNISolone (MEDROL DOSEPAK) 4 MG TBPK tablet; 6-day pack as directed  Perennial allergic rhinitis -     COMPLETE METABOLIC PANEL WITH GFR -     methylPREDNISolone (MEDROL DOSEPAK) 4 MG TBPK tablet; 6-day pack as directed  Elevated LDL cholesterol level -     Lipid panel   Suspect more sinusitis and allergies causing tinnitus Treated with zpak and medrol dose pack Fasting labs ordered If not improving can consider imaging to work up tinnitus.    Iran Planas, PA-C

## 2021-09-11 NOTE — Patient Instructions (Signed)
Add flonase Start zpak and medrol dose pack  If not improvement will get CT scans

## 2022-02-05 ENCOUNTER — Other Ambulatory Visit: Payer: Self-pay | Admitting: Neurology

## 2022-02-05 DIAGNOSIS — F411 Generalized anxiety disorder: Secondary | ICD-10-CM

## 2022-02-05 MED ORDER — DULOXETINE HCL 20 MG PO CPEP: 20.0000 mg | ORAL_CAPSULE | Freq: Every day | ORAL | 0 refills | Status: DC

## 2022-02-11 ENCOUNTER — Other Ambulatory Visit: Payer: Self-pay | Admitting: Physician Assistant

## 2022-02-11 DIAGNOSIS — F411 Generalized anxiety disorder: Secondary | ICD-10-CM

## 2022-05-01 ENCOUNTER — Other Ambulatory Visit: Payer: Self-pay | Admitting: Physician Assistant

## 2022-05-01 DIAGNOSIS — F411 Generalized anxiety disorder: Secondary | ICD-10-CM

## 2022-05-08 ENCOUNTER — Ambulatory Visit: Payer: 59 | Admitting: Physician Assistant

## 2022-05-08 VITALS — BP 130/80 | HR 79 | Ht 63.0 in | Wt 162.0 lb

## 2022-05-08 DIAGNOSIS — Z0000 Encounter for general adult medical examination without abnormal findings: Secondary | ICD-10-CM

## 2022-05-08 DIAGNOSIS — Z1211 Encounter for screening for malignant neoplasm of colon: Secondary | ICD-10-CM

## 2022-05-08 DIAGNOSIS — H6993 Unspecified Eustachian tube disorder, bilateral: Secondary | ICD-10-CM | POA: Insufficient documentation

## 2022-05-08 DIAGNOSIS — E663 Overweight: Secondary | ICD-10-CM

## 2022-05-08 DIAGNOSIS — G8929 Other chronic pain: Secondary | ICD-10-CM

## 2022-05-08 DIAGNOSIS — M5442 Lumbago with sciatica, left side: Secondary | ICD-10-CM

## 2022-05-08 DIAGNOSIS — Z13220 Encounter for screening for lipoid disorders: Secondary | ICD-10-CM

## 2022-05-08 DIAGNOSIS — M5441 Lumbago with sciatica, right side: Secondary | ICD-10-CM

## 2022-05-08 DIAGNOSIS — J3089 Other allergic rhinitis: Secondary | ICD-10-CM

## 2022-05-08 DIAGNOSIS — F411 Generalized anxiety disorder: Secondary | ICD-10-CM

## 2022-05-08 DIAGNOSIS — Z131 Encounter for screening for diabetes mellitus: Secondary | ICD-10-CM

## 2022-05-08 LAB — CBC WITH DIFFERENTIAL/PLATELET
Basophils Relative: 1.3 %
Eosinophils Absolute: 141 cells/uL (ref 15–500)
Eosinophils Relative: 3.8 %
Hemoglobin: 13.8 g/dL (ref 11.7–15.5)
MCV: 90.5 fL (ref 80.0–100.0)
Monocytes Relative: 11.3 %
Total Lymphocyte: 37.5 %
WBC: 3.7 10*3/uL — ABNORMAL LOW (ref 3.8–10.8)

## 2022-05-08 MED ORDER — FLUTICASONE PROPIONATE 50 MCG/ACT NA SUSP
2.0000 | Freq: Every day | NASAL | 2 refills | Status: AC
Start: 2022-05-08 — End: ?

## 2022-05-08 MED ORDER — PHENTERMINE-TOPIRAMATE ER 11.25-69 MG PO CP24: 1.0000 | ORAL_CAPSULE | Freq: Every morning | ORAL | 0 refills | Status: DC

## 2022-05-08 MED ORDER — PHENTERMINE-TOPIRAMATE ER 15-92 MG PO CP24: 1.0000 | ORAL_CAPSULE | Freq: Every morning | ORAL | 0 refills | Status: DC

## 2022-05-08 MED ORDER — PHENTERMINE-TOPIRAMATE ER 7.5-46 MG PO CP24: 1.0000 | ORAL_CAPSULE | Freq: Every morning | ORAL | 0 refills | Status: DC

## 2022-05-08 MED ORDER — DULOXETINE HCL 20 MG PO CPEP
20.0000 mg | ORAL_CAPSULE | Freq: Every day | ORAL | 3 refills | Status: DC
Start: 2022-05-08 — End: 2023-04-29

## 2022-05-08 NOTE — Progress Notes (Addendum)
Established Patient Office Visit  Subjective   Patient ID: Adrienne Lara, female    DOB: 06/21/1965  Age: 57 y.o. MRN: [id number]  Chief Complaint  Patient presents with   Follow-up    HPI Pt is a 57 yo overweight female with chronic low back pain who presents to the clinic for medication refills.   She is doing ok with pain and needs cymbalta refilled. She is trying to lose weight to help more with pain. She is doing my fitness pal, weight watchers, keto and exercising regularly with no weight loss. She would like help. Tried IF but felt terrible.   .. Active Ambulatory Problems    Diagnosis Date Noted   NEVUS, ATYPICAL 11/10/2006   HYPERLIPIDEMIA 12/26/2005   DISORDER, DYSTHYMIC 12/26/2005   SINUSITIS, ACUTE NOS 10/23/2006   Perennial allergic rhinitis 12/23/2005   EXCESSIVE MENSTRUATION 10/23/2006   DISC DISEASE, CERVICAL 12/30/2006   NECK PAIN 12/23/2005   PAIN IN THORACIC SPINE 12/26/2005   MYALGIA 10/23/2006   HEADACHE 12/23/2005   IUD (intrauterine device) in place 08/16/2015   Mild intermittent asthma, uncomplicated 08/25/2013   Secondary amenorrhea 08/25/2017   Breast cancer screening, high risk patient 08/25/2017   Family history of breast cancer in mother 08/25/2017   White coat syndrome without diagnosis of hypertension 08/25/2017   Abnormal weight gain 08/25/2017   Migraine with aura and without status migrainosus, not intractable 08/25/2017   GAD (generalized anxiety disorder) 08/25/2017   Overweight with body mass index (BMI) 25.0-29.9 09/23/2017   Reactive airway disease 01/23/2018   Encounter for long-term (current) use of medications 01/23/2018   Chronic left-sided low back pain with bilateral sciatica 07/23/2018   Chronic left hip pain 07/23/2018   Lipoma of left thigh 07/23/2018   Environmental allergies 02/24/2019   Hot flashes 03/23/2021   Elevated blood pressure reading 03/23/2021   Tinnitus of both ears 09/11/2021   Elevated LDL  cholesterol level 09/11/2021   ETD (Eustachian tube dysfunction), bilateral 05/08/2022   Resolved Ambulatory Problems    Diagnosis Date Noted   ANXIETY 12/23/2005   Colon cancer screening 01/23/2018   Past Medical History:  Diagnosis Date   Allergy    Anxiety    Asthma    Heart murmur      ROS See HPI.    Objective:     BP 130/80   Pulse 79   Ht  (1.6 m)   Wt 162 lb (73.5 kg)   SpO2 99%   BMI 28.70 kg/m  BP Readings from Last 3 Encounters:  05/08/22 130/80  09/11/21 (!) 157/88  03/23/21 (!) 155/85   Wt Readings from Last 3 Encounters:  05/08/22 162 lb (73.5 kg)  09/11/21 158 lb (71.7 kg)  03/23/21 163 lb (73.9 kg)      Physical Exam Constitutional:      Appearance: Normal appearance.  HENT:     Head: Normocephalic.     Right Ear: Tympanic membrane, ear canal and external ear normal. There is no impacted cerumen.     Left Ear: Tympanic membrane, ear canal and external ear normal. There is no impacted cerumen.     Mouth/Throat:     Mouth: Mucous membranes are moist.  Eyes:     Conjunctiva/sclera: Conjunctivae normal.  Cardiovascular:     Rate and Rhythm: Normal rate and regular rhythm.     Pulses: Normal pulses.     Heart sounds: Murmur heard.  Pulmonary:     Effort: Pulmonary effort is  normal.     Breath sounds: Normal breath sounds.  Musculoskeletal:     Cervical back: Normal range of motion and neck supple.     Right lower leg: No edema.     Left lower leg: No edema.  Neurological:     General: No focal deficit present.     Mental Status: She is alert and oriented to person, place, and time.  Psychiatric:        Mood and Affect: Mood normal.       The 10-year ASCVD risk score (Arnett DK, et al., 2019) is: 2.3%    Assessment & Plan:  Marland KitchenMarland KitchenShayra was seen today for follow-up.  Diagnoses and all orders for this visit:  Chronic left-sided low back pain with bilateral sciatica -     Phentermine-Topiramate 7.5-46 MG CP24; Take 1  capsule by mouth every morning. -     Phentermine-Topiramate 11.25-69 MG CP24; Take 1 capsule by mouth every morning. -     Phentermine-Topiramate 15-92 MG CP24; Take 1 capsule by mouth every morning. -     Cologuard  GAD (generalized anxiety disorder) -     COMPLETE METABOLIC PANEL WITH GFR -     Cologuard -     DULoxetine (CYMBALTA) 20 MG capsule; Take 1 capsule (20 mg total) by mouth daily.  Screening for diabetes mellitus -     COMPLETE METABOLIC PANEL WITH GFR -     Cologuard  Screening for lipid disorders -     Lipid Panel w/reflex Direct LDL -     Cologuard  Preventative health care -     Lipid Panel w/reflex Direct LDL -     COMPLETE METABOLIC PANEL WITH GFR -     TSH -     CBC w/Diff/Platelet -     Cologuard  Colon cancer screening -     Cologuard  Overweight with body mass index (BMI) 25.0-29.9 -     Phentermine-Topiramate 7.5-46 MG CP24; Take 1 capsule by mouth every morning. -     Phentermine-Topiramate 11.25-69 MG CP24; Take 1 capsule by mouth every morning. -     Phentermine-Topiramate 15-92 MG CP24; Take 1 capsule by mouth every morning. -     Cologuard  Perennial allergic rhinitis -     Cologuard  ETD (Eustachian tube dysfunction), bilateral -     Cologuard -     fluticasone (FLONASE) 50 MCG/ACT nasal spray; Place 2 sprays into both nostrils daily.   Refilled cymbalta for chronic pain Discussed weight loss Start qsymia(BMI over 27 with co-morbidity) Discussed side effects and titration up Follow up in 3 months Fasting labs ordered Cologuard ordered Flonase with ETD   Return in about 3 months (around 08/07/2022) for weight.    Tandy Gaw, PA-C

## 2022-05-08 NOTE — Patient Instructions (Addendum)
Start qsymia for weight slowly increase dose Follow up in 3 months RETURN cologuard

## 2022-05-09 LAB — CBC WITH DIFFERENTIAL/PLATELET
Absolute Monocytes: 418 cells/uL (ref 200–950)
Basophils Absolute: 48 cells/uL (ref 0–200)
HCT: 41.2 % (ref 35.0–45.0)
Lymphs Abs: 1388 cells/uL (ref 850–3900)
MCH: 30.3 pg (ref 27.0–33.0)
MCHC: 33.5 g/dL (ref 32.0–36.0)
MPV: 10.5 fL (ref 7.5–12.5)
Neutro Abs: 1706 cells/uL (ref 1500–7800)
Neutrophils Relative %: 46.1 %
Platelets: 269 10*3/uL (ref 140–400)
RBC: 4.55 10*6/uL (ref 3.80–5.10)
RDW: 12.5 % (ref 11.0–15.0)

## 2022-05-09 LAB — COMPLETE METABOLIC PANEL WITH GFR
AG Ratio: 1.8 (calc) (ref 1.0–2.5)
ALT: 18 U/L (ref 6–29)
AST: 23 U/L (ref 10–35)
Albumin: 4.8 g/dL (ref 3.6–5.1)
Alkaline phosphatase (APISO): 58 U/L (ref 37–153)
BUN: 19 mg/dL (ref 7–25)
CO2: 28 mmol/L (ref 20–32)
Calcium: 10 mg/dL (ref 8.6–10.4)
Chloride: 100 mmol/L (ref 98–110)
Creat: 0.75 mg/dL (ref 0.50–1.03)
Globulin: 2.6 g/dL (calc) (ref 1.9–3.7)
Glucose, Bld: 99 mg/dL (ref 65–99)
Potassium: 4.9 mmol/L (ref 3.5–5.3)
Sodium: 136 mmol/L (ref 135–146)
Total Bilirubin: 0.7 mg/dL (ref 0.2–1.2)
Total Protein: 7.4 g/dL (ref 6.1–8.1)
eGFR: 93 mL/min/{1.73_m2} (ref >=60)

## 2022-05-09 LAB — LIPID PANEL W/REFLEX DIRECT LDL
Cholesterol: 261 mg/dL — ABNORMAL HIGH (ref <200)
HDL: 81 mg/dL (ref >=50)
LDL Cholesterol (Calc): 162 mg/dL (calc) — ABNORMAL HIGH
Non-HDL Cholesterol (Calc): 180 mg/dL (calc) — ABNORMAL HIGH (ref <130)
Total CHOL/HDL Ratio: 3.2 (calc) (ref <5.0)
Triglycerides: 74 mg/dL (ref <150)

## 2022-05-09 LAB — TSH: TSH: 1.68 mIU/L (ref 0.40–4.50)

## 2022-05-10 ENCOUNTER — Telehealth: Payer: Self-pay

## 2022-05-10 ENCOUNTER — Encounter: Payer: Self-pay | Admitting: Physician Assistant

## 2022-05-10 NOTE — Progress Notes (Signed)
Adrienne Lara,   HDL is amazing.  Your LDL stable from last year but not to goal.  Overall 10 year CV risk is still low. Continue with diet and exercise. Recheck in 1 year.   [name].The 10-year ASCVD risk score (Arnett DK, et al., 2019) is: 2.1%   Values used to calculate the score:     Age: 57 years     Sex: Female     Is Non-Hispanic African American: No     Diabetic: No     Tobacco smoker: No     Systolic Blood Pressure: 130 mmHg     Is BP treated: No     HDL Cholesterol: 81 mg/dL     Total Cholesterol: 261 mg/dL  Kidney, liver, glucose look great.  Thyroid looks great.  WBC just a hair low. No concerns.  Hemoglobin looks good.

## 2022-05-10 NOTE — Telephone Encounter (Addendum)
Initiated Prior authorization ZOX:WRUEAV 7.5-46MG  er capsules Via: Covermymeds Case/Key: [id number]  Status: denied  as of 05/10/22 Reason:plan exclusion, weight loss supplementation are  an excluded benefit  Notified Pt via: Mychart

## 2022-05-14 ENCOUNTER — Encounter: Payer: Self-pay | Admitting: Physician Assistant

## 2022-05-20 ENCOUNTER — Encounter: Payer: Self-pay | Admitting: Physician Assistant

## 2022-05-22 ENCOUNTER — Encounter: Payer: Self-pay | Admitting: Physician Assistant

## 2022-05-30 ENCOUNTER — Encounter: Payer: Self-pay | Admitting: Physician Assistant

## 2022-05-31 MED ORDER — PHENTERMINE HCL 15 MG PO CAPS: 15.0000 mg | ORAL_CAPSULE | ORAL | 0 refills | Status: DC

## 2022-05-31 MED ORDER — TOPIRAMATE 50 MG PO TABS: ORAL_TABLET | ORAL | 0 refills | Status: DC

## 2022-06-08 ENCOUNTER — Other Ambulatory Visit: Payer: Self-pay | Admitting: Physician Assistant

## 2022-06-08 DIAGNOSIS — F411 Generalized anxiety disorder: Secondary | ICD-10-CM

## 2022-08-07 ENCOUNTER — Ambulatory Visit: Payer: Self-pay | Admitting: Physician Assistant

## 2022-08-14 ENCOUNTER — Encounter: Payer: Self-pay | Admitting: Physician Assistant

## 2022-08-14 ENCOUNTER — Ambulatory Visit: Payer: BC Managed Care – PPO | Admitting: Physician Assistant

## 2022-08-14 VITALS — BP 138/80 | HR 87 | Ht 63.0 in | Wt 155.1 lb

## 2022-08-14 DIAGNOSIS — E663 Overweight: Secondary | ICD-10-CM

## 2022-08-14 DIAGNOSIS — R7301 Impaired fasting glucose: Secondary | ICD-10-CM

## 2022-08-14 DIAGNOSIS — Z124 Encounter for screening for malignant neoplasm of cervix: Secondary | ICD-10-CM | POA: Diagnosis not present

## 2022-08-14 DIAGNOSIS — Z8639 Personal history of other endocrine, nutritional and metabolic disease: Secondary | ICD-10-CM

## 2022-08-14 LAB — POCT GLYCOSYLATED HEMOGLOBIN (HGB A1C): Hemoglobin A1C: 5.4 % (ref 4.0–5.6)

## 2022-08-14 MED ORDER — TOPIRAMATE 50 MG PO TABS
ORAL_TABLET | ORAL | 3 refills | Status: DC
Start: 2022-08-14 — End: 2022-09-26

## 2022-08-14 MED ORDER — PHENTERMINE HCL 15 MG PO CAPS
15.0000 mg | ORAL_CAPSULE | ORAL | 0 refills | Status: DC
Start: 2022-08-14 — End: 2022-09-04

## 2022-08-14 NOTE — Progress Notes (Signed)
Established Patient Office Visit  Subjective   Patient ID: Adrienne Lara, female    DOB: 1965-09-06  Age: 57 y.o. MRN: [id number]  Chief Complaint  Patient presents with   Medical Management of Chronic Issues    49-month follow up. Denies any concerns for today's visit.    HPI Patient is a 57 year old overweight female who presents to the clinic to follow-up on weight loss.  She has a history of elevated fasting glucose and has not had an A1c.  She is exercising regularly with weights in the gym and cardio.  She has made diet changes to increase protein and decrease sugars and carbs.  She has lost 7 pounds since May.  She does feel better and she likes progress.  She denies any concerns or side effects with current medications.  She is aware she needs a Pap and to return her Cologuard kit. She would like referral for pap to [facility] in [place].   .. Active Ambulatory Problems    Diagnosis Date Noted   NEVUS, ATYPICAL 11/10/2006   HYPERLIPIDEMIA 12/26/2005   DISORDER, DYSTHYMIC 12/26/2005   SINUSITIS, ACUTE NOS 10/23/2006   Perennial allergic rhinitis 12/23/2005   EXCESSIVE MENSTRUATION 10/23/2006   DISC DISEASE, CERVICAL 12/30/2006   NECK PAIN 12/23/2005   PAIN IN THORACIC SPINE 12/26/2005   MYALGIA 10/23/2006   HEADACHE 12/23/2005   IUD (intrauterine device) in place 08/16/2015   Mild intermittent asthma, uncomplicated 08/25/2013   Secondary amenorrhea 08/25/2017   Breast cancer screening, high risk patient 08/25/2017   Family history of breast cancer in mother 08/25/2017   White coat syndrome without diagnosis of hypertension 08/25/2017   Abnormal weight gain 08/25/2017   Migraine with aura and without status migrainosus, not intractable 08/25/2017   GAD (generalized anxiety disorder) 08/25/2017   Overweight with body mass index (BMI) 25.0-29.9 09/23/2017   Reactive airway disease 01/23/2018   Encounter for long-term (current) use of medications 01/23/2018   Chronic  left-sided low back pain with bilateral sciatica 07/23/2018   Chronic left hip pain 07/23/2018   Lipoma of left thigh 07/23/2018   Environmental allergies 02/24/2019   Hot flashes 03/23/2021   Elevated blood pressure reading 03/23/2021   Tinnitus of both ears 09/11/2021   Elevated LDL cholesterol level 09/11/2021   ETD (Eustachian tube dysfunction), bilateral 05/08/2022   History of obesity 08/14/2022   Elevated fasting glucose 08/14/2022   Resolved Ambulatory Problems    Diagnosis Date Noted   ANXIETY 12/23/2005   Colon cancer screening 01/23/2018   Past Medical History:  Diagnosis Date   Allergy    Anxiety    Asthma    Heart murmur      ROS See HPI.    Objective:     BP 138/80 (BP Location: Left Arm, Patient Position: Sitting, Cuff Size: Normal)   Pulse 87   Ht 5\' 3"  (1.6 m)   Wt 155 lb 1.6 oz (70.4 kg)   SpO2 100%   BMI 27.47 kg/m  BP Readings from Last 3 Encounters:  08/14/22 138/80  05/08/22 130/80  09/11/21 (!) 157/88   Wt Readings from Last 3 Encounters:  08/14/22 155 lb 1.6 oz (70.4 kg)  05/08/22 162 lb (73.5 kg)  09/11/21 158 lb (71.7 kg)    .[name] Results for orders placed or performed in visit on 08/14/22  POCT glycosylated hemoglobin (Hb A1C)  Result Value Ref Range   Hemoglobin A1C 5.4 4.0 - 5.6 %   HbA1c POC (<> result, manual entry)  HbA1c, POC (prediabetic range)     HbA1c, POC (controlled diabetic range)       Physical Exam Constitutional:      Appearance: Normal appearance.  HENT:     Head: Normocephalic.  Cardiovascular:     Rate and Rhythm: Normal rate.     Pulses: Normal pulses.     Heart sounds: Normal heart sounds.  Pulmonary:     Effort: Pulmonary effort is normal.     Breath sounds: Normal breath sounds.  Musculoskeletal:     Cervical back: Normal range of motion and neck supple. No tenderness.     Right lower leg: No edema.     Left lower leg: No edema.  Lymphadenopathy:     Cervical: No cervical adenopathy.   Neurological:     General: No focal deficit present.     Mental Status: She is alert and oriented to person, place, and time.  Psychiatric:        Mood and Affect: Mood normal.        The 10-year ASCVD risk score (Arnett DK, et al., 2019) is: 2.6%    Assessment & Plan:  Marland KitchenMarland KitchenMckinzey "[name]" was seen today for medical management of chronic issues.  Diagnoses and all orders for this visit:  Overweight with body mass index (BMI) 25.0-29.9 -     phentermine 15 MG capsule; Take 1 capsule (15 mg total) by mouth every morning. -     topiramate (TOPAMAX) 50 MG tablet; Take one tablet in the evening.  Elevated fasting glucose -     phentermine 15 MG capsule; Take 1 capsule (15 mg total) by mouth every morning. -     topiramate (TOPAMAX) 50 MG tablet; Take one tablet in the evening. -     POCT glycosylated hemoglobin (Hb A1C)  History of obesity -     phentermine 15 MG capsule; Take 1 capsule (15 mg total) by mouth every morning. -     topiramate (TOPAMAX) 50 MG tablet; Take one tablet in the evening.  Cervical cancer screening -     Ambulatory referral to Obstetrics / Gynecology   A1C normal at 5.4  Down 7lbs and tolerating medication well Continue phentermine and topmax Follow up in 6months.   Referral for routine pap sent to [facility] at patients request Agree to return cologuard that she has at her house Knows importance of preventative care   Return in about 6 months (around 02/14/2023).    Tandy Gaw, PA-C

## 2022-08-22 ENCOUNTER — Encounter: Payer: Self-pay | Admitting: Physician Assistant

## 2022-09-03 ENCOUNTER — Other Ambulatory Visit: Payer: Self-pay | Admitting: Physician Assistant

## 2022-09-03 DIAGNOSIS — Z8639 Personal history of other endocrine, nutritional and metabolic disease: Secondary | ICD-10-CM

## 2022-09-03 DIAGNOSIS — E663 Overweight: Secondary | ICD-10-CM

## 2022-09-03 DIAGNOSIS — R7301 Impaired fasting glucose: Secondary | ICD-10-CM

## 2022-09-04 NOTE — Telephone Encounter (Signed)
..  PDMP reviewed during this encounter. No concerns Next visit in 3 months

## 2022-09-26 ENCOUNTER — Other Ambulatory Visit: Payer: Self-pay | Admitting: Physician Assistant

## 2022-09-26 DIAGNOSIS — Z8639 Personal history of other endocrine, nutritional and metabolic disease: Secondary | ICD-10-CM

## 2022-09-26 DIAGNOSIS — E663 Overweight: Secondary | ICD-10-CM

## 2022-09-26 DIAGNOSIS — R7301 Impaired fasting glucose: Secondary | ICD-10-CM

## 2022-10-07 ENCOUNTER — Encounter: Payer: Self-pay | Admitting: Physician Assistant

## 2022-10-07 DIAGNOSIS — Z8639 Personal history of other endocrine, nutritional and metabolic disease: Secondary | ICD-10-CM

## 2022-10-07 DIAGNOSIS — R7301 Impaired fasting glucose: Secondary | ICD-10-CM

## 2022-10-07 DIAGNOSIS — E663 Overweight: Secondary | ICD-10-CM

## 2022-10-07 MED ORDER — TOPIRAMATE 50 MG PO TABS
ORAL_TABLET | ORAL | 1 refills | Status: DC
Start: 2022-10-07 — End: 2023-02-25

## 2022-12-09 ENCOUNTER — Encounter: Payer: Self-pay | Admitting: Physician Assistant

## 2022-12-16 ENCOUNTER — Encounter: Payer: Self-pay | Admitting: Physician Assistant

## 2022-12-16 ENCOUNTER — Ambulatory Visit: Payer: BC Managed Care – PPO | Admitting: Physician Assistant

## 2022-12-16 VITALS — HR 78 | Ht 63.0 in | Wt 150.2 lb

## 2022-12-16 DIAGNOSIS — Z85828 Personal history of other malignant neoplasm of skin: Secondary | ICD-10-CM

## 2022-12-16 DIAGNOSIS — Z1211 Encounter for screening for malignant neoplasm of colon: Secondary | ICD-10-CM | POA: Diagnosis not present

## 2022-12-16 DIAGNOSIS — R238 Other skin changes: Secondary | ICD-10-CM | POA: Insufficient documentation

## 2022-12-16 DIAGNOSIS — L989 Disorder of the skin and subcutaneous tissue, unspecified: Secondary | ICD-10-CM | POA: Diagnosis not present

## 2022-12-16 MED ORDER — TRIAMCINOLONE ACETONIDE 0.1 % MT PSTE: 1.0000 | PASTE | Freq: Two times a day (BID) | OROMUCOSAL | 1 refills | Status: DC

## 2022-12-16 NOTE — Progress Notes (Signed)
Acute Office Visit  Subjective:     Patient ID: Adrienne Lara, female    DOB: [birth year], 57 y.o.   MRN: [id number]  Chief Complaint  Patient presents with   Nevus    Left side cheek jaw area    HPI Patient is in today for left submental dark lesion that has been present for about 6 months. She has not noticed it growing or change shape. PMH of precancerous lesion excised on her back about 20 years ago. Tanning beds in her 47s. She uses facial sunscreen 50 spf.  She also reports a dark spot in the middle of her bottom lip for the past ~15 yrs. She thinks it has become more noticeable. She has been told in the past it is vascular and would need cosmetic treatment. It fluctuates in size. She denies pain.  .. Active Ambulatory Problems    Diagnosis Date Noted   Benign neoplasm of skin 11/10/2006   HYPERLIPIDEMIA 12/26/2005   DISORDER, DYSTHYMIC 12/26/2005   SINUSITIS, ACUTE NOS 10/23/2006   Perennial allergic rhinitis 12/23/2005   EXCESSIVE MENSTRUATION 10/23/2006   DISC DISEASE, CERVICAL 12/30/2006   NECK PAIN 12/23/2005   PAIN IN THORACIC SPINE 12/26/2005   MYALGIA 10/23/2006   Headache 12/23/2005   IUD (intrauterine device) in place 08/16/2015   Mild intermittent asthma, uncomplicated 08/25/2013   Secondary amenorrhea 08/25/2017   Breast cancer screening, high risk patient 08/25/2017   Family history of breast cancer in mother 08/25/2017   White coat syndrome without diagnosis of hypertension 08/25/2017   Abnormal weight gain 08/25/2017   Migraine with aura and without status migrainosus, not intractable 08/25/2017   GAD (generalized anxiety disorder) 08/25/2017   Overweight with body mass index (BMI) 25.0-29.9 09/23/2017   Reactive airway disease 01/23/2018   Encounter for long-term (current) use of medications 01/23/2018   Chronic left-sided low back pain with bilateral sciatica 07/23/2018   Chronic left hip pain 07/23/2018   Lipoma of left thigh 07/23/2018    Environmental allergies 02/24/2019   Hot flashes 03/23/2021   Elevated blood pressure reading 03/23/2021   Tinnitus of both ears 09/11/2021   Elevated LDL cholesterol level 09/11/2021   ETD (Eustachian tube dysfunction), bilateral 05/08/2022   History of obesity 08/14/2022   Elevated fasting glucose 08/14/2022   History of skin cancer 12/16/2022   Skin lesion of face 12/16/2022   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          Comments: Small dark blue lesion, slightly raised, with irregular borders located in left submental region (purple circle). Purple/blue raised lesion on the middle of the bottom lip; similar lesions on tongue.       Assessment & Plan:  Marland KitchenMarland KitchenElizabeth "[name]" was seen today for nevus.  Diagnoses and all orders for this visit:  Skin lesion of face -     triamcinolone (KENALOG) 0.1 % paste; Use as directed 1 Application in the mouth or throat 2 (two) times daily. -     Ambulatory referral to Dermatology  History of skin cancer -     Ambulatory referral to Dermatology  Venous lake of lip -     Ambulatory referral to Dermatology  Colon cancer screening -     Ambulatory referral to Gastroenterology    Most likely vascular etiology for submental, lip, and tongue such as venous lake. Triamcinolone dental paste to place on lip lesion BID.  I suspect skin lesion of left lower jaw line is benign and likely blue nevus. Dermatology referral for consult and intervention.    Tandy Gaw, PA-C

## 2023-02-14 ENCOUNTER — Ambulatory Visit: Payer: BC Managed Care – PPO | Admitting: Physician Assistant

## 2023-02-21 ENCOUNTER — Ambulatory Visit: Payer: BC Managed Care – PPO | Admitting: Physician Assistant

## 2023-02-25 ENCOUNTER — Ambulatory Visit (INDEPENDENT_AMBULATORY_CARE_PROVIDER_SITE_OTHER): Payer: BC Managed Care – PPO | Admitting: Physician Assistant

## 2023-02-25 VITALS — BP 122/84 | HR 82 | Ht 63.0 in | Wt 151.0 lb

## 2023-02-25 DIAGNOSIS — Z8639 Personal history of other endocrine, nutritional and metabolic disease: Secondary | ICD-10-CM | POA: Diagnosis not present

## 2023-02-25 DIAGNOSIS — R7301 Impaired fasting glucose: Secondary | ICD-10-CM

## 2023-02-25 DIAGNOSIS — E663 Overweight: Secondary | ICD-10-CM | POA: Diagnosis not present

## 2023-02-25 DIAGNOSIS — Z1211 Encounter for screening for malignant neoplasm of colon: Secondary | ICD-10-CM

## 2023-02-25 MED ORDER — TOPIRAMATE 50 MG PO TABS
ORAL_TABLET | ORAL | 1 refills | Status: DC
Start: 2023-02-25 — End: 2023-05-02

## 2023-02-25 MED ORDER — PHENTERMINE HCL 15 MG PO CAPS
15.0000 mg | ORAL_CAPSULE | ORAL | 0 refills | Status: DC
Start: 2023-02-25 — End: 2023-05-29

## 2023-02-25 NOTE — Progress Notes (Signed)
 Established Patient Office Visit  Subjective   Patient ID: Adrienne Lara, female    DOB: 25-Aug-1965  Age: 58 y.o. MRN: [id number]  Chief Complaint  Patient presents with   Medical Management of Chronic Issues    Wgt management     HPI Pt is a 58 yo female who presents to the clinic for weight management. Pt is exercising 4-5 times a week and keeping a healthy diet. She hs not lost more weight but feels like clothes fitting better and feels stronger. She is doing cardio and strength training. Taking phentermine  and topamax  without difficulty.   .. Active Ambulatory Problems    Diagnosis Date Noted   Benign neoplasm of skin 11/10/2006   HYPERLIPIDEMIA 12/26/2005   DISORDER, DYSTHYMIC 12/26/2005   SINUSITIS, ACUTE NOS 10/23/2006   Perennial allergic rhinitis 12/23/2005   EXCESSIVE MENSTRUATION 10/23/2006   DISC DISEASE, CERVICAL 12/30/2006   NECK PAIN 12/23/2005   PAIN IN THORACIC SPINE 12/26/2005   MYALGIA 10/23/2006   Headache 12/23/2005   IUD (intrauterine device) in place 08/16/2015   Mild intermittent asthma, uncomplicated 08/25/2013   Secondary amenorrhea 08/25/2017   Breast cancer screening, high risk patient 08/25/2017   Family history of breast cancer in mother 08/25/2017   White coat syndrome without diagnosis of hypertension 08/25/2017   Abnormal weight gain 08/25/2017   Migraine with aura and without status migrainosus, not intractable 08/25/2017   GAD (generalized anxiety disorder) 08/25/2017   Overweight with body mass index (BMI) 25.0-29.9 09/23/2017   Reactive airway disease 01/23/2018   Encounter for long-term (current) use of medications 01/23/2018   Chronic left-sided low back pain with bilateral sciatica 07/23/2018   Chronic left hip pain 07/23/2018   Lipoma of left thigh 07/23/2018   Environmental allergies 02/24/2019   Hot flashes 03/23/2021   Elevated blood pressure reading 03/23/2021   Tinnitus of both ears 09/11/2021   Elevated LDL  cholesterol level 09/11/2021   ETD (Eustachian tube dysfunction), bilateral 05/08/2022   History of obesity 08/14/2022   Elevated fasting glucose 08/14/2022   History of skin cancer 12/16/2022   Skin lesion of face 12/16/2022   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     Objective:     BP 122/84   Pulse 82   Ht 5' 3 (1.6 m)   Wt 151 lb (68.5 kg)   SpO2 99%   BMI 26.75 kg/m  BP Readings from Last 3 Encounters:  02/25/23 122/84  08/14/22 138/80  05/08/22 130/80   Wt Readings from Last 3 Encounters:  02/25/23 151 lb (68.5 kg)  12/16/22 150 lb 4 oz (68.2 kg)  08/14/22 155 lb 1.6 oz (70.4 kg)      Physical Exam Constitutional:      Appearance: Normal appearance.  HENT:     Head: Normocephalic.  Cardiovascular:     Rate and Rhythm: Normal rate and regular rhythm.  Pulmonary:     Effort: Pulmonary effort is normal.     Breath sounds: Normal breath sounds.  Neurological:     General: No focal deficit present.     Mental Status: She is alert and oriented to  person, place, and time.  Psychiatric:        Mood and Affect: Mood normal.       The 10-year ASCVD risk score (Arnett DK, et al., 2019) is: 2.1%    Assessment & Plan:  SABRASABRAJasiel Apachito was seen today for medical management of chronic issues.  Diagnoses and all orders for this visit:  Overweight with body mass index (BMI) 25.0-29.9 -     phentermine  15 MG capsule; Take 1 capsule (15 mg total) by mouth every morning. TAKE 1 CAPSULE(15 MG) BY MOUTH EVERY MORNING -     topiramate  (TOPAMAX ) 50 MG tablet; TAKE 1 TABLET BY MOUTH IN THE MORNING AND EVENING.  Elevated fasting glucose -     phentermine  15 MG capsule; Take 1 capsule (15 mg  total) by mouth every morning. TAKE 1 CAPSULE(15 MG) BY MOUTH EVERY MORNING -     topiramate  (TOPAMAX ) 50 MG tablet; TAKE 1 TABLET BY MOUTH IN THE MORNING AND EVENING.  History of obesity -     phentermine  15 MG capsule; Take 1 capsule (15 mg total) by mouth every morning. TAKE 1 CAPSULE(15 MG) BY MOUTH EVERY MORNING -     topiramate  (TOPAMAX ) 50 MG tablet; TAKE 1 TABLET BY MOUTH IN THE MORNING AND EVENING.  Colon cancer screening -     Ambulatory referral to Gastroenterology  Pt is doing great Keep up good work and focus on inches and body fat percentage Refilled topamax  and phentermine  Make sure getting 150 minutes of exercise a week and 80g of protein  Pt agreed to colonoscopy-referral placed  Pt declined pneumonia vaccine today  Follow up in 6 months.    Tabitha Tupper, PA-C

## 2023-02-26 ENCOUNTER — Encounter: Payer: Self-pay | Admitting: Physician Assistant

## 2023-04-09 ENCOUNTER — Telehealth: Admitting: Physician Assistant

## 2023-04-09 DIAGNOSIS — J019 Acute sinusitis, unspecified: Secondary | ICD-10-CM | POA: Diagnosis not present

## 2023-04-09 DIAGNOSIS — B9689 Other specified bacterial agents as the cause of diseases classified elsewhere: Secondary | ICD-10-CM

## 2023-04-09 MED ORDER — DOXYCYCLINE HYCLATE 100 MG PO TABS: 100.0000 mg | ORAL_TABLET | Freq: Two times a day (BID) | ORAL | 0 refills | Status: DC

## 2023-04-09 NOTE — Progress Notes (Signed)
 E-Visit for Sinus Problems  We are sorry that you are not feeling well.  Here is how we plan to help!  Based on what you have shared with me it looks like you have sinusitis.  Sinusitis is inflammation and infection in the sinus cavities of the head.  Based on your presentation I believe you most likely have Acute Bacterial Sinusitis.  This is an infection caused by bacteria and is treated with antibiotics. I have prescribed Doxycycline 100mg  by mouth twice a day for 10 days. You may use an oral decongestant such as Mucinex D or if you have glaucoma or high blood pressure use plain Mucinex. I recommend an OTC nasal steroid spray like Flonase or Nasacort to help with sinus pressure/congestion but also to alleviate ear pressure and discomfort.  Saline nasal spray help and can safely be used as often as needed for congestion.  If you develop worsening sinus pain, fever or notice severe headache and vision changes, or if symptoms are not better after completion of antibiotic, please schedule an appointment with a health care provider.    Sinus infections are not as easily transmitted as other respiratory infection, however we still recommend that you avoid close contact with loved ones, especially the very young and elderly.  Remember to wash your hands thoroughly throughout the day as this is the number one way to prevent the spread of infection!  Home Care: Only take medications as instructed by your medical team. Complete the entire course of an antibiotic. Do not take these medications with alcohol. A steam or ultrasonic humidifier can help congestion.  You can place a towel over your head and breathe in the steam from hot water coming from a faucet. Avoid close contacts especially the very young and the elderly. Cover your mouth when you cough or sneeze. Always remember to wash your hands.  Get Help Right Away If: You develop worsening fever or sinus pain. You develop a severe head ache or visual  changes. Your symptoms persist after you have completed your treatment plan.  Make sure you Understand these instructions. Will watch your condition. Will get help right away if you are not doing well or get worse.  Thank you for choosing an e-visit.  Your e-visit answers were reviewed by a board certified advanced clinical practitioner to complete your personal care plan. Depending upon the condition, your plan could have included both over the counter or prescription medications.  Please review your pharmacy choice. Make sure the pharmacy is open so you can pick up prescription now. If there is a problem, you may contact your provider through [employer] and have the prescription routed to another pharmacy.  Your safety is important to [place]. If you have drug allergies check your prescription carefully.   For the next 24 hours you can use MyChart to ask questions about today's visit, request a non-urgent call back, or ask for a work or school excuse. You will get an email in the next two days asking about your experience. I hope that your e-visit has been valuable and will speed your recovery.

## 2023-04-09 NOTE — Progress Notes (Signed)
 I have spent 5 minutes in review of e-visit questionnaire, review and updating patient chart, medical decision making and response to patient.   Piedad Climes, PA-C

## 2023-04-29 ENCOUNTER — Other Ambulatory Visit: Payer: Self-pay | Admitting: Physician Assistant

## 2023-04-29 DIAGNOSIS — F411 Generalized anxiety disorder: Secondary | ICD-10-CM

## 2023-04-29 MED ORDER — DULOXETINE HCL 20 MG PO CPEP
20.0000 mg | ORAL_CAPSULE | Freq: Every day | ORAL | 0 refills | Status: DC
Start: 2023-04-29 — End: 2023-05-02

## 2023-04-29 NOTE — Telephone Encounter (Signed)
 Copied from CRM [id number]. Topic: Clinical - Medication Refill >> Apr 29, 2023  8:20 AM Louie Boston wrote: Most Recent Primary Care Visit:  Provider: Jomarie Longs  Department: [facility]  Visit Type: OFFICE VISIT  Date: 02/25/2023  Medication: DULoxetine (CYMBALTA) 20 MG capsule  Has the patient contacted their pharmacy? Yes (Agent: If no, request that the patient contact the pharmacy for the refill. If patient does not wish to contact the pharmacy document the reason why and proceed with request.) (Agent: If yes, when and what did the pharmacy advise?)  Patient was advised that the refill request was submitted through [facility]. There is no request in the system. Patient is out of medication and needs a refill, she has been waiting since 4/2.   Is this the correct pharmacy for this prescription? Yes If no, delete pharmacy and type the correct one.  This is the patient's preferred pharmacy:  [id number] - [place], [place] - [address] AT [facility] [address] [place] [address] Phone: [phone] Fax: [phone]   Has the prescription been filled recently? No  Is the patient out of the medication? Yes  Has the patient been seen for an appointment in the last year OR does the patient have an upcoming appointment? Yes  Can we respond through MyChart? Yes  Agent: Please be advised that Rx refills may take up to 3 business days. We ask that you follow-up with your pharmacy.

## 2023-05-01 ENCOUNTER — Encounter: Payer: Self-pay | Admitting: Physician Assistant

## 2023-05-01 ENCOUNTER — Other Ambulatory Visit: Payer: Self-pay | Admitting: Physician Assistant

## 2023-05-01 DIAGNOSIS — F411 Generalized anxiety disorder: Secondary | ICD-10-CM

## 2023-05-01 DIAGNOSIS — E663 Overweight: Secondary | ICD-10-CM

## 2023-05-01 DIAGNOSIS — R7301 Impaired fasting glucose: Secondary | ICD-10-CM

## 2023-05-01 DIAGNOSIS — Z8639 Personal history of other endocrine, nutritional and metabolic disease: Secondary | ICD-10-CM

## 2023-05-01 NOTE — Telephone Encounter (Signed)
 Adrienne Lara, please see mychart sent by pt and advise.

## 2023-05-02 MED ORDER — DULOXETINE HCL 20 MG PO CPEP
20.0000 mg | ORAL_CAPSULE | Freq: Every day | ORAL | 1 refills | Status: DC
Start: 2023-05-02 — End: 2023-09-09

## 2023-05-02 MED ORDER — DULOXETINE HCL 20 MG PO CPEP: 20.0000 mg | ORAL_CAPSULE | Freq: Every day | ORAL | 0 refills | Status: DC

## 2023-05-02 NOTE — Addendum Note (Signed)
 Addended byRoselyn Reef on: 05/02/2023 04:55 PM   Modules accepted: Orders

## 2023-05-02 NOTE — Addendum Note (Signed)
 Addended byRoselyn Reef on: 05/02/2023 01:20 PM   Modules accepted: Orders

## 2023-05-29 ENCOUNTER — Other Ambulatory Visit: Payer: Self-pay | Admitting: Physician Assistant

## 2023-05-29 DIAGNOSIS — R7301 Impaired fasting glucose: Secondary | ICD-10-CM

## 2023-05-29 DIAGNOSIS — E663 Overweight: Secondary | ICD-10-CM

## 2023-05-29 DIAGNOSIS — Z8639 Personal history of other endocrine, nutritional and metabolic disease: Secondary | ICD-10-CM

## 2023-05-29 MED ORDER — PHENTERMINE HCL 15 MG PO CAPS: 15.0000 mg | ORAL_CAPSULE | ORAL | 0 refills | Status: DC

## 2023-05-29 NOTE — Addendum Note (Signed)
 Addended by: Araceli Knight on: 05/29/2023 11:24 AM   Modules accepted: Orders

## 2023-07-31 ENCOUNTER — Encounter

## 2023-07-31 ENCOUNTER — Ambulatory Visit
Admission: RE | Admit: 2023-07-31 | Discharge: 2023-07-31 | Disposition: A | Discharge status: Home / Self Care | Attending: Family Medicine | Admitting: Family Medicine

## 2023-07-31 VITALS — BP 138/89 | HR 74 | Temp 98.5°F | Resp 17

## 2023-07-31 DIAGNOSIS — H10212 Acute toxic conjunctivitis, left eye: Secondary | ICD-10-CM

## 2023-07-31 MED ORDER — NEOMYCIN-POLYMYXIN-DEXAMETH 3.5-10000-0.1 OP SUSP: 1.0000 [drp] | OPHTHALMIC | 0 refills | Status: AC

## 2023-07-31 NOTE — ED Provider Notes (Signed)
 [facility]    CSN: [id number] Arrival date & time: 07/31/23  0933      History   Chief Complaint Chief Complaint  Patient presents with   Eye Problem    LT    HPI Adrienne Lara is a 58 y.o. female.   HPI Patient states that she used a new contact lens cleaning solution for the first time yesterday.  It is called clear Care Plus it is a hydrogen peroxide product.  She did not follow package instructions.  Associate with a contact back of the eye she had burning.  She rinsed out the eye but left the contact again.  When she remove the contacts sometime later her eye was quite red and irritated.  Vision is normal.  No yellow crusting or discharge.  Slight tearing.  Eye feels inflamed.  No foreign body sensation.  Contact lens has been removed Past Medical History:  Diagnosis Date   Allergy    Anxiety    Asthma    Heart murmur     Patient Active Problem List   Diagnosis Date Noted   History of skin cancer 12/16/2022   Skin lesion of face 12/16/2022   Venous lake of lip 12/16/2022   History of obesity 08/14/2022   Elevated fasting glucose 08/14/2022   ETD (Eustachian tube dysfunction), bilateral 05/08/2022   Tinnitus of both ears 09/11/2021   Elevated LDL cholesterol level 09/11/2021   Hot flashes 03/23/2021   Elevated blood pressure reading 03/23/2021   Environmental allergies 02/24/2019   Chronic left-sided low back pain with bilateral sciatica 07/23/2018   Chronic left hip pain 07/23/2018   Lipoma of left thigh 07/23/2018   Reactive airway disease 01/23/2018   Encounter for long-term (current) use of medications 01/23/2018   Overweight with body mass index (BMI) 25.0-29.9 09/23/2017   Secondary amenorrhea 08/25/2017   Breast cancer screening, high risk patient 08/25/2017   Family history of breast cancer in mother 08/25/2017   White coat syndrome without diagnosis of hypertension 08/25/2017   Abnormal weight gain 08/25/2017   Migraine with aura  and without status migrainosus, not intractable 08/25/2017   GAD (generalized anxiety disorder) 08/25/2017   IUD (intrauterine device) in place 08/16/2015   Mild intermittent asthma, uncomplicated 08/25/2013   DISC DISEASE, CERVICAL 12/30/2006   Benign neoplasm of skin 11/10/2006   SINUSITIS, ACUTE NOS 10/23/2006   EXCESSIVE MENSTRUATION 10/23/2006   MYALGIA 10/23/2006   HYPERLIPIDEMIA 12/26/2005   DISORDER, DYSTHYMIC 12/26/2005   PAIN IN THORACIC SPINE 12/26/2005   Perennial allergic rhinitis 12/23/2005   NECK PAIN 12/23/2005   Headache 12/23/2005    Past Surgical History:  Procedure Laterality Date   APPENDECTOMY     BREAST BIOPSY Right    INTRAUTERINE DEVICE (IUD) INSERTION     REDUCTION MAMMAPLASTY      OB History   No obstetric history on file.      Home Medications    Prior to Admission medications   Medication Sig Start Date End Date Taking? Authorizing Provider  neomycin -polymyxin b-dexamethasone  (MAXITROL) 3.5-10000-0.1 SUSP Place 1 drop into the right eye every 4 (four) hours for 5 days. 07/31/23 08/05/23 Yes Maranda Jamee Jacob, MD  DULoxetine  (CYMBALTA ) 20 MG capsule Take 1 capsule (20 mg total) by mouth daily. 05/02/23   Breeback, Jade L, PA-C  fluticasone  (FLONASE ) 50 MCG/ACT nasal spray Place 2 sprays into both nostrils daily. 05/08/22   Breeback, Jade L, PA-C  phentermine  15 MG capsule Take 1 capsule (15 mg  total) by mouth every morning. TAKE 1 CAPSULE(15 MG) BY MOUTH EVERY MORNING 05/29/23   Breeback, Jade L, PA-C  topiramate  (TOPAMAX ) 50 MG tablet TAKE 1 TABLET BY MOUTH TWICE DAILY 05/02/23   Breeback, Jade L, PA-C    Family History Family History  Problem Relation Age of Onset   Breast cancer Mother 25   Obesity Mother    Obesity Sister    Heart attack Sister    Alcohol abuse Sister    Alcohol abuse Brother     Social History Social History   Tobacco Use   Smoking status: Former   Smokeless tobacco: Never  Substance Use Topics   Alcohol use: Yes     Alcohol/week: 7.0 standard drinks of alcohol    Types: 7 Standard drinks or equivalent per week   Drug use: Never     Allergies   Penicillins, Sulfa antibiotics, and Sulfonamide derivatives   Review of Systems Review of Systems See HPI  Physical Exam Triage Vital Signs ED Triage Vitals  Encounter Vitals Group     BP 07/31/23 0943 (!) 155/92     Girls Systolic BP Percentile --      Girls Diastolic BP Percentile --      Boys Systolic BP Percentile --      Boys Diastolic BP Percentile --      Pulse Rate 07/31/23 0943 74     Resp 07/31/23 0943 17     Temp 07/31/23 0943 98.5 F (36.9 C)     Temp Source 07/31/23 0943 Oral     SpO2 07/31/23 0943 100 %     Weight --      Height --      Head Circumference --      Peak Flow --      Pain Score 07/31/23 0944 4     Pain Loc --      Pain Education --      Exclude from Growth Chart --    No data found.  Updated Vital Signs BP 138/89 (BP Location: Right Arm)   Pulse 74   Temp 98.5 F (36.9 C) (Oral)   Resp 17   SpO2 100%  :     Physical Exam Constitutional:      General: She is not in acute distress.    Appearance: She is well-developed and normal weight.  HENT:     Head: Normocephalic and atraumatic.  Eyes:     Conjunctiva/sclera: Conjunctivae normal.     Pupils: Pupils are equal, round, and reactive to light.     Comments: Left eye has moderate to marked conjunctival injection.  Mild lid swelling.  Clear discharge.  Fluorescein exam is negative.  No foreign body is seen  Cardiovascular:     Rate and Rhythm: Normal rate.  Pulmonary:     Effort: Pulmonary effort is normal. No respiratory distress.  Abdominal:     General: There is no distension.     Palpations: Abdomen is soft.  Musculoskeletal:        General: Normal range of motion.     Cervical back: Normal range of motion.  Skin:    General: Skin is warm and dry.  Neurological:     Mental Status: She is alert.      UC Treatments / Results   Labs (all labs ordered are listed, but only abnormal results are displayed) Labs Reviewed - No data to display  EKG   Radiology No results found.  Procedures Procedures (including  critical care time)  Medications Ordered in UC Medications - No data to display  Initial Impression / Assessment and Plan / UC Course  I have reviewed the triage vital signs and the nursing notes.  Pertinent labs & imaging results that were available during my care of the patient were reviewed by me and considered in my medical decision making (see chart for details).     I feel that the patient likely has a chemical conjunctivitis from putting the hydrogen peroxide solution in her eye improperly.  No evidence of corneal abrasion.  Will treat with eyedrops and observation. Final Clinical Impressions(s) / UC Diagnoses   Final diagnoses:  Chemical conjunctivitis of left eye     Discharge Instructions      May use eyedrops 3-4 times a day Do not use longer than 5 days If this is not resolved by Monday see your eye doctor If you get worse at any time synovator go to the ER   ED Prescriptions     Medication Sig Dispense Auth. Provider   neomycin -polymyxin b-dexamethasone  (MAXITROL) 3.5-10000-0.1 SUSP Place 1 drop into the right eye every 4 (four) hours for 5 days. 1.5 mL Maranda Jamee Jacob, MD      PDMP not reviewed this encounter.   Maranda Jamee Jacob, MD 07/31/23 [phone]

## 2023-07-31 NOTE — Discharge Instructions (Signed)
 May use eyedrops 3-4 times a day Do not use longer than 5 days If this is not resolved by Monday see your eye doctor If you get worse at any time synovator go to the ER

## 2023-07-31 NOTE — ED Triage Notes (Signed)
 Pt c/o LT eye redness and burning sensation since last night. Says she used a new contact solution (Clear Care Plus). Flushed with water, no relief.

## 2023-08-22 ENCOUNTER — Other Ambulatory Visit: Payer: Self-pay | Admitting: Physician Assistant

## 2023-08-22 DIAGNOSIS — E663 Overweight: Secondary | ICD-10-CM

## 2023-08-22 DIAGNOSIS — R7301 Impaired fasting glucose: Secondary | ICD-10-CM

## 2023-08-22 DIAGNOSIS — Z8639 Personal history of other endocrine, nutritional and metabolic disease: Secondary | ICD-10-CM

## 2023-08-25 NOTE — Telephone Encounter (Signed)
 Hold for 09/09/23 appt

## 2023-08-26 ENCOUNTER — Ambulatory Visit: Payer: BC Managed Care – PPO | Admitting: Physician Assistant

## 2023-09-01 ENCOUNTER — Encounter: Payer: Self-pay | Admitting: Physician Assistant

## 2023-09-01 DIAGNOSIS — R7301 Impaired fasting glucose: Secondary | ICD-10-CM

## 2023-09-01 DIAGNOSIS — Z8639 Personal history of other endocrine, nutritional and metabolic disease: Secondary | ICD-10-CM

## 2023-09-01 DIAGNOSIS — E663 Overweight: Secondary | ICD-10-CM

## 2023-09-01 MED ORDER — PHENTERMINE HCL 15 MG PO CAPS: 15.0000 mg | ORAL_CAPSULE | ORAL | 0 refills | Status: DC

## 2023-09-01 NOTE — Telephone Encounter (Signed)
 Patient requesting enough rx rf of Phentermine  15mg   ( last written 05/29/2023) to last until upcoming appt on 09/09/2023 Last OV 02/25/2023

## 2023-09-09 ENCOUNTER — Encounter: Payer: Self-pay | Admitting: Physician Assistant

## 2023-09-09 ENCOUNTER — Ambulatory Visit: Admitting: Physician Assistant

## 2023-09-09 VITALS — BP 118/70 | HR 71 | Wt 154.0 lb

## 2023-09-09 DIAGNOSIS — Z1231 Encounter for screening mammogram for malignant neoplasm of breast: Secondary | ICD-10-CM

## 2023-09-09 DIAGNOSIS — E663 Overweight: Secondary | ICD-10-CM | POA: Diagnosis not present

## 2023-09-09 DIAGNOSIS — Z8639 Personal history of other endocrine, nutritional and metabolic disease: Secondary | ICD-10-CM | POA: Diagnosis not present

## 2023-09-09 DIAGNOSIS — R7301 Impaired fasting glucose: Secondary | ICD-10-CM | POA: Diagnosis not present

## 2023-09-09 DIAGNOSIS — F411 Generalized anxiety disorder: Secondary | ICD-10-CM | POA: Diagnosis not present

## 2023-09-09 MED ORDER — PHENTERMINE HCL 15 MG PO CAPS: 15.0000 mg | ORAL_CAPSULE | ORAL | 0 refills | Status: DC

## 2023-09-09 MED ORDER — TOPIRAMATE 50 MG PO TABS: 50.0000 mg | ORAL_TABLET | Freq: Two times a day (BID) | ORAL | 1 refills | Status: AC

## 2023-09-09 MED ORDER — DULOXETINE HCL 20 MG PO CPEP
20.0000 mg | ORAL_CAPSULE | Freq: Every day | ORAL | 1 refills | Status: AC
Start: 2023-09-09 — End: ?

## 2023-09-09 NOTE — Progress Notes (Signed)
 Established Patient Office Visit  Subjective   Patient ID: ROSHELL BRIGHAM, female    DOB: 02-Aug-1965  Age: 58 y.o. MRN: [id number]  Chief Complaint  Patient presents with   Medical Management of Chronic Issues    HPI Pt is a 58 yo female who presents to the clinic to follow up on weight. She has gained 3lbs over last 6 months. She has had tons of vacations, celebrations and parties and ready to get back on track. Her mood is good with no concerns. She denies any pain. She has good energy. She is tolerating topamax  and phentermine  well. She denies any problems sleeping or with palpitations.    Review of Systems  All other systems reviewed and are negative.     Objective:     BP 118/70   Pulse 71   Wt 154 lb (69.9 kg)   BMI 27.28 kg/m  BP Readings from Last 3 Encounters:  09/09/23 118/70  07/31/23 138/89  02/25/23 122/84   Wt Readings from Last 3 Encounters:  09/09/23 154 lb (69.9 kg)  02/25/23 151 lb (68.5 kg)  12/16/22 150 lb 4 oz (68.2 kg)      Physical Exam Constitutional:      Appearance: Normal appearance.  Cardiovascular:     Rate and Rhythm: Normal rate and regular rhythm.  Pulmonary:     Effort: Pulmonary effort is normal.     Breath sounds: Normal breath sounds.  Musculoskeletal:     Right lower leg: No edema.     Left lower leg: No edema.  Neurological:     General: No focal deficit present.     Mental Status: She is oriented to person, place, and time.  Psychiatric:        Mood and Affect: Mood normal.     The 10-year ASCVD risk score (Arnett DK, et al., 2019) is: 2.1%    Assessment & Plan:  SABRASABRANiesha Bame was seen today for medical management of chronic issues.  Diagnoses and all orders for this visit:  Overweight with body mass index (BMI) 25.0-29.9 -     Discontinue: phentermine  15 MG capsule; Take 1 capsule (15 mg total) by mouth every morning. TAKE 1 CAPSULE(15 MG) BY MOUTH EVERY MORNING -     topiramate  (TOPAMAX ) 50 MG  tablet; Take 1 tablet (50 mg total) by mouth 2 (two) times daily. -     phentermine  15 MG capsule; Take 1 capsule (15 mg total) by mouth every morning. TAKE 1 CAPSULE(15 MG) BY MOUTH EVERY MORNING  Elevated fasting glucose -     Discontinue: phentermine  15 MG capsule; Take 1 capsule (15 mg total) by mouth every morning. TAKE 1 CAPSULE(15 MG) BY MOUTH EVERY MORNING -     topiramate  (TOPAMAX ) 50 MG tablet; Take 1 tablet (50 mg total) by mouth 2 (two) times daily. -     phentermine  15 MG capsule; Take 1 capsule (15 mg total) by mouth every morning. TAKE 1 CAPSULE(15 MG) BY MOUTH EVERY MORNING  GAD (generalized anxiety disorder) -     DULoxetine  (CYMBALTA ) 20 MG capsule; Take 1 capsule (20 mg total) by mouth daily.  History of obesity -     Discontinue: phentermine  15 MG capsule; Take 1 capsule (15 mg total) by mouth every morning. TAKE 1 CAPSULE(15 MG) BY MOUTH EVERY MORNING -     topiramate  (TOPAMAX ) 50 MG tablet; Take 1 tablet (50 mg total) by mouth 2 (two) times daily. -  phentermine  15 MG capsule; Take 1 capsule (15 mg total) by mouth every morning. TAKE 1 CAPSULE(15 MG) BY MOUTH EVERY MORNING  Visit for screening mammogram -     MM 3D SCREENING MAMMOGRAM BILATERAL BREAST   Needs fasting labs and CPE will schedule in 6 months Refilled phentermine  and topamax  Discussed healthy diet and regular exercise Mammogram ordered Cymbalta  refilled patients mood is great   Vermell Bologna, PA-C

## 2023-09-10 ENCOUNTER — Ambulatory Visit (INDEPENDENT_AMBULATORY_CARE_PROVIDER_SITE_OTHER)

## 2023-09-10 DIAGNOSIS — Z1231 Encounter for screening mammogram for malignant neoplasm of breast: Secondary | ICD-10-CM | POA: Diagnosis not present

## 2023-09-15 ENCOUNTER — Ambulatory Visit: Payer: Self-pay | Admitting: Physician Assistant

## 2023-09-15 NOTE — Progress Notes (Signed)
Normal mammogram follow up in 1 year.

## 2023-09-20 ENCOUNTER — Encounter

## 2023-10-05 ENCOUNTER — Other Ambulatory Visit: Payer: Self-pay | Admitting: Physician Assistant

## 2023-10-05 DIAGNOSIS — Z8639 Personal history of other endocrine, nutritional and metabolic disease: Secondary | ICD-10-CM

## 2023-10-05 DIAGNOSIS — E663 Overweight: Secondary | ICD-10-CM

## 2023-10-05 DIAGNOSIS — R7301 Impaired fasting glucose: Secondary | ICD-10-CM

## 2023-10-06 ENCOUNTER — Encounter: Payer: Self-pay | Admitting: Physician Assistant

## 2023-11-05 ENCOUNTER — Telehealth: Payer: Self-pay

## 2023-11-05 NOTE — Telephone Encounter (Signed)
 Adrienne Lara is going to have a colonoscopy.

## 2023-12-24 ENCOUNTER — Telehealth: Admitting: Family Medicine

## 2023-12-24 DIAGNOSIS — J019 Acute sinusitis, unspecified: Secondary | ICD-10-CM | POA: Diagnosis not present

## 2023-12-24 DIAGNOSIS — B9689 Other specified bacterial agents as the cause of diseases classified elsewhere: Secondary | ICD-10-CM

## 2023-12-24 DIAGNOSIS — R051 Acute cough: Secondary | ICD-10-CM | POA: Diagnosis not present

## 2023-12-24 MED ORDER — BENZONATATE 100 MG PO CAPS: 100.0000 mg | ORAL_CAPSULE | Freq: Three times a day (TID) | ORAL | 0 refills | Status: DC | PRN

## 2023-12-24 MED ORDER — AZITHROMYCIN 250 MG PO TABS: ORAL_TABLET | ORAL | 0 refills | Status: AC

## 2023-12-24 MED ORDER — PROMETHAZINE-DM 6.25-15 MG/5ML PO SYRP: 5.0000 mL | ORAL_SOLUTION | Freq: Four times a day (QID) | ORAL | 0 refills | Status: DC | PRN

## 2023-12-24 MED ORDER — ALBUTEROL SULFATE HFA 108 (90 BASE) MCG/ACT IN AERS: 1.0000 | INHALATION_SPRAY | RESPIRATORY_TRACT | 0 refills | Status: AC | PRN

## 2023-12-24 NOTE — Progress Notes (Signed)
 Virtual Visit Consent   Adrienne Lara, you are scheduled for a virtual visit with a [facility] provider today. Just as with appointments in the office, your consent must be obtained to participate. Your consent will be active for this visit and any virtual visit you may have with one of our providers in the next 365 days. If you have a MyChart account, a copy of this consent can be sent to you electronically.  As this is a virtual visit, video technology does not allow for your provider to perform a traditional examination. This may limit your provider's ability to fully assess your condition. If your provider identifies any concerns that need to be evaluated in person or the need to arrange testing (such as labs, EKG, etc.), we will make arrangements to do so. Although advances in technology are sophisticated, we cannot ensure that it will always work on either your end or our end. If the connection with a video visit is poor, the visit may have to be switched to a telephone visit. With either a video or telephone visit, we are not always able to ensure that we have a secure connection.  By engaging in this virtual visit, you consent to the provision of healthcare and authorize for your insurance to be billed (if applicable) for the services provided during this visit. Depending on your insurance coverage, you may receive a charge related to this service.  I need to obtain your verbal consent now. Are you willing to proceed with your visit today? Adrienne Lara has provided verbal consent on 12/24/2023 for a virtual visit (video or telephone). Chiquita [name], NP  Date: 12/24/2023 8:32 AM   Virtual Visit via Video Note   I, Chiquita [name], connected with  Adrienne Lara  ([id number], 1966/01/12) on 12/24/23 at  8:30 AM EST by a video-enabled telemedicine application and verified that I am speaking with the correct person using two identifiers.  Location: Patient: Virtual Visit  Location Patient: Home Provider: Virtual Visit Location Provider: Home Office   I discussed the limitations of evaluation and management by telemedicine and the availability of in person appointments. The patient expressed understanding and agreed to proceed.    History of Present Illness: Adrienne Lara is a 58 y.o. who identifies as a female who was assigned female at birth, and is being seen today for sinus congestion  Onset was over the weekend 5 days ago- sore throat, and runny nose  Associated symptoms are cough that is disturbing sleep - mostly is dry- but feels mucus at times- thinks PND  Modifying factors are nyquil, flonase , saline spray, motrin  Denies chest pain, shortness of breath, fevers, chills  Exposure to sick contacts- daughter came home from college last week and she got sick  COVID test: neg  Vaccines: none recently, no flu vaccine    Problems:  Patient Active Problem List   Diagnosis Date Noted   History of skin cancer 12/16/2022   Skin lesion of face 12/16/2022   Venous lake of lip 12/16/2022   History of obesity 08/14/2022   Elevated fasting glucose 08/14/2022   ETD (Eustachian tube dysfunction), bilateral 05/08/2022   Tinnitus of both ears 09/11/2021   Elevated LDL cholesterol level 09/11/2021   Hot flashes 03/23/2021   Elevated blood pressure reading 03/23/2021   Environmental allergies 02/24/2019   Chronic left-sided low back pain with bilateral sciatica 07/23/2018   Chronic left hip pain 07/23/2018   Lipoma of left thigh  07/23/2018   Reactive airway disease 01/23/2018   Encounter for long-term (current) use of medications 01/23/2018   Overweight with body mass index (BMI) 25.0-29.9 09/23/2017   Secondary amenorrhea 08/25/2017   Breast cancer screening, high risk patient 08/25/2017   Family history of breast cancer in mother 08/25/2017   White coat syndrome without diagnosis of hypertension 08/25/2017   Abnormal weight gain 08/25/2017    Migraine with aura and without status migrainosus, not intractable 08/25/2017   GAD (generalized anxiety disorder) 08/25/2017   IUD (intrauterine device) in place 08/16/2015   Mild intermittent asthma, uncomplicated 08/25/2013   DISC DISEASE, CERVICAL 12/30/2006   Benign neoplasm of skin 11/10/2006   SINUSITIS, ACUTE NOS 10/23/2006   EXCESSIVE MENSTRUATION 10/23/2006   MYALGIA 10/23/2006   HYPERLIPIDEMIA 12/26/2005   DISORDER, DYSTHYMIC 12/26/2005   PAIN IN THORACIC SPINE 12/26/2005   Perennial allergic rhinitis 12/23/2005   NECK PAIN 12/23/2005   Headache 12/23/2005    Allergies:  Allergies  Allergen Reactions   Penicillins Hives   Sulfa Antibiotics Hives   Sulfonamide Derivatives    Medications:  Current Outpatient Medications:    DULoxetine  (CYMBALTA ) 20 MG capsule, Take 1 capsule (20 mg total) by mouth daily., Disp: 90 capsule, Rfl: 1   fluticasone  (FLONASE ) 50 MCG/ACT nasal spray, Place 2 sprays into both nostrils daily., Disp: 16 g, Rfl: 2   phentermine  15 MG capsule, TAKE 1 CAPSULE(15 MG) BY MOUTH EVERY MORNING, Disp: 30 capsule, Rfl: 0   topiramate  (TOPAMAX ) 50 MG tablet, Take 1 tablet (50 mg total) by mouth 2 (two) times daily., Disp: 180 tablet, Rfl: 1  Observations/Objective: Patient is well-developed, well-nourished in no acute distress.  Resting comfortably  at home.  Head is normocephalic, atraumatic.  No labored breathing.  Speech is clear and coherent with logical content.  Patient is alert and oriented at baseline.    Assessment and Plan:  1. Acute bacterial sinusitis (Primary)  - azithromycin  (ZITHROMAX ) 250 MG tablet; Take 2 tablets on day 1, then 1 tablet daily on days 2 through 5  Dispense: 6 tablet; Refill: 0 - promethazine -dextromethorphan (PROMETHAZINE -DM) 6.25-15 MG/5ML syrup; Take 5 mLs by mouth 4 (four) times daily as needed for cough.  Dispense: 118 mL; Refill: 0 - benzonatate  (TESSALON ) 100 MG capsule; Take 1 capsule (100 mg total) by mouth 3  (three) times daily as needed for cough.  Dispense: 30 capsule; Refill: 0 - albuterol  (VENTOLIN  HFA) 108 (90 Base) MCG/ACT inhaler; Inhale 1-2 puffs into the lungs every 4 (four) hours as needed for wheezing or shortness of breath (cough).  Dispense: 8 g; Refill: 0  2. Acute cough  - promethazine -dextromethorphan (PROMETHAZINE -DM) 6.25-15 MG/5ML syrup; Take 5 mLs by mouth 4 (four) times daily as needed for cough.  Dispense: 118 mL; Refill: 0 - benzonatate  (TESSALON ) 100 MG capsule; Take 1 capsule (100 mg total) by mouth 3 (three) times daily as needed for cough.  Dispense: 30 capsule; Refill: 0 - albuterol  (VENTOLIN  HFA) 108 (90 Base) MCG/ACT inhaler; Inhale 1-2 puffs into the lungs every 4 (four) hours as needed for wheezing or shortness of breath (cough).  Dispense: 8 g; Refill: 0   URI recommendations: - Increased rest - Increasing Fluids - Acetaminophen / ibuprofen as needed for fever/pain.  - Salt water gargling, chloraseptic spray and throat lozenges - Saline nasal spray if congestion or if nasal passages feel dry. - Humidifying the air.     Reviewed side effects, risks and benefits of medication.    Patient acknowledged agreement  and understanding of the plan.   Past Medical, Surgical, Social History, Allergies, and Medications have been Reviewed.    Follow Up Instructions: I discussed the assessment and treatment plan with the patient. The patient was provided an opportunity to ask questions and all were answered. The patient agreed with the plan and demonstrated an understanding of the instructions.  A copy of instructions were sent to the patient via MyChart unless otherwise noted below.    The patient was advised to call back or seek an in-person evaluation if the symptoms worsen or if the condition fails to improve as anticipated.    Chiquita [name], NP

## 2023-12-24 NOTE — Patient Instructions (Addendum)
 Adrienne Lara, thank you for joining Adrienne [name], NP for today's virtual visit.  While this provider is not your primary care provider (PCP), if your PCP is located in our provider database this encounter information will be shared with them immediately following your visit.   A [facility] MyChart account gives you access to today's visit and all your visits, tests, and labs performed at [facility]  click here if you don't have a [facility] MyChart account or go to [email or web address]  Consent: (Patient) Adrienne Lara provided verbal consent for this virtual visit at the beginning of the encounter.  Current Medications:  Current Outpatient Medications:    albuterol  (VENTOLIN  HFA) 108 (90 Base) MCG/ACT inhaler, Inhale 1-2 puffs into the lungs every 4 (four) hours as needed for wheezing or shortness of breath (cough)., Disp: 8 g, Rfl: 0   azithromycin  (ZITHROMAX ) 250 MG tablet, Take 2 tablets on day 1, then 1 tablet daily on days 2 through 5, Disp: 6 tablet, Rfl: 0   benzonatate  (TESSALON ) 100 MG capsule, Take 1 capsule (100 mg total) by mouth 3 (three) times daily as needed for cough., Disp: 30 capsule, Rfl: 0   promethazine-dextromethorphan (PROMETHAZINE-DM) 6.25-15 MG/5ML syrup, Take 5 mLs by mouth 4 (four) times daily as needed for cough., Disp: 118 mL, Rfl: 0   DULoxetine  (CYMBALTA ) 20 MG capsule, Take 1 capsule (20 mg total) by mouth daily., Disp: 90 capsule, Rfl: 1   fluticasone  (FLONASE ) 50 MCG/ACT nasal spray, Place 2 sprays into both nostrils daily., Disp: 16 g, Rfl: 2   phentermine  15 MG capsule, TAKE 1 CAPSULE(15 MG) BY MOUTH EVERY MORNING, Disp: 30 capsule, Rfl: 0   topiramate  (TOPAMAX ) 50 MG tablet, Take 1 tablet (50 mg total) by mouth 2 (two) times daily., Disp: 180 tablet, Rfl: 1   Medications ordered in this encounter:  Meds ordered this encounter  Medications   azithromycin  (ZITHROMAX ) 250 MG tablet    Sig: Take 2 tablets on day 1, then 1  tablet daily on days 2 through 5    Dispense:  6 tablet    Refill:  0    Supervising Provider:   LAMPTEY, PHILIP Lara [[id number]]   promethazine-dextromethorphan (PROMETHAZINE-DM) 6.25-15 MG/5ML syrup    Sig: Take 5 mLs by mouth 4 (four) times daily as needed for cough.    Dispense:  118 mL    Refill:  0    Supervising Provider:   BLAISE ALEENE Lara [id number]   benzonatate  (TESSALON ) 100 MG capsule    Sig: Take 1 capsule (100 mg total) by mouth 3 (three) times daily as needed for cough.    Dispense:  30 capsule    Refill:  0    Supervising Provider:   LAMPTEY, PHILIP Lara [[id number]]   albuterol  (VENTOLIN  HFA) 108 (90 Base) MCG/ACT inhaler    Sig: Inhale 1-2 puffs into the lungs every 4 (four) hours as needed for wheezing or shortness of breath (cough).    Dispense:  8 g    Refill:  0    Supervising Provider:   BLAISE ALEENE Lara [[id number]]     *If you need refills on other medications prior to your next appointment, please contact your pharmacy*  Follow-Up: Call back or seek an in-person evaluation if the symptoms worsen or if the condition fails to improve as anticipated.  [facility] [phone]  Other Instructions   URI recommendations: - Increased rest - Increasing Fluids - Acetaminophen / ibuprofen as  needed for fever/pain.  - Salt water gargling, chloraseptic spray and throat lozenges - Saline nasal spray if congestion or if nasal passages feel dry. - Humidifying the air.      If you have been instructed to have an in-person evaluation today at a local Urgent Care facility, please use the link below. It will take you to a list of all of our available [facility], including address, phone number and hours of operation. Please do not delay care.  [facility]  If you or a family member do not have a primary care provider, use the link below to schedule a visit and establish care. When you choose a [facility] primary care physician or  advanced practice provider, you gain a long-term partner in health. Find a Primary Care Provider  Learn more about [facility]'s in-office and virtual care options: [facility] - Get Care Now

## 2024-01-09 NOTE — Telephone Encounter (Signed)
 Please call and schedule

## 2024-02-17 ENCOUNTER — Encounter: Payer: Self-pay | Admitting: Physician Assistant

## 2024-02-17 DIAGNOSIS — Z8639 Personal history of other endocrine, nutritional and metabolic disease: Secondary | ICD-10-CM

## 2024-02-17 DIAGNOSIS — R7301 Impaired fasting glucose: Secondary | ICD-10-CM

## 2024-02-17 DIAGNOSIS — E663 Overweight: Secondary | ICD-10-CM

## 2024-02-17 MED ORDER — PHENTERMINE HCL 15 MG PO CAPS: 15.0000 mg | ORAL_CAPSULE | ORAL | 0 refills | Status: AC

## 2024-03-12 ENCOUNTER — Ambulatory Visit: Admitting: Physician Assistant
